# Patient Record
Sex: Male | Born: 1965
Health system: Southern US, Community
[De-identification: ages and names within clinical notes are randomized; demographics above are authoritative.]

---

## 2005-11-17 ENCOUNTER — Emergency Department (HOSPITAL_COMMUNITY): Admission: EM | Admit: 2005-11-17 | Discharge: 2005-11-17 | Payer: Self-pay | Admitting: Family Medicine

## 2006-09-11 ENCOUNTER — Emergency Department (HOSPITAL_COMMUNITY): Admission: EM | Admit: 2006-09-11 | Discharge: 2006-09-11 | Payer: Self-pay | Admitting: Family Medicine

## 2007-03-17 ENCOUNTER — Emergency Department (HOSPITAL_COMMUNITY): Admission: EM | Admit: 2007-03-17 | Discharge: 2007-03-17 | Payer: Self-pay | Admitting: Family Medicine

## 2007-05-17 ENCOUNTER — Emergency Department (HOSPITAL_COMMUNITY): Admission: EM | Admit: 2007-05-17 | Discharge: 2007-05-17 | Payer: Self-pay | Admitting: Family Medicine

## 2010-11-11 LAB — INFLUENZA A AND B ANTIGEN (CONVERTED LAB): Inflenza A Ag: NEGATIVE

## 2010-11-15 LAB — POCT RAPID STREP A: Streptococcus, Group A Screen (Direct): NEGATIVE

## 2011-02-27 ENCOUNTER — Emergency Department (HOSPITAL_COMMUNITY): Payer: No Typology Code available for payment source

## 2011-02-27 ENCOUNTER — Emergency Department (HOSPITAL_COMMUNITY)
Admission: EM | Admit: 2011-02-27 | Discharge: 2011-02-27 | Disposition: A | Discharge status: Home / Self Care | Payer: No Typology Code available for payment source | Attending: Emergency Medicine | Admitting: Emergency Medicine

## 2011-02-27 DIAGNOSIS — Y9229 Other specified public building as the place of occurrence of the external cause: Secondary | ICD-10-CM | POA: Insufficient documentation

## 2011-02-27 DIAGNOSIS — M545 Low back pain, unspecified: Secondary | ICD-10-CM | POA: Insufficient documentation

## 2011-02-27 DIAGNOSIS — S20219A Contusion of unspecified front wall of thorax, initial encounter: Secondary | ICD-10-CM

## 2011-02-27 DIAGNOSIS — R079 Chest pain, unspecified: Secondary | ICD-10-CM | POA: Insufficient documentation

## 2011-02-27 DIAGNOSIS — S39012A Strain of muscle, fascia and tendon of lower back, initial encounter: Secondary | ICD-10-CM

## 2011-02-27 MED ORDER — DIAZEPAM 5 MG PO TABS: 5.0000 mg | ORAL_TABLET | Freq: Three times a day (TID) | ORAL | Status: DC | PRN

## 2011-02-27 MED ORDER — DIAZEPAM 5 MG PO TABS
5.0000 mg | ORAL_TABLET | Freq: Once | ORAL | Status: AC
  Administered 2011-02-27: 5 mg via ORAL
  Filled 2011-02-27: qty 1

## 2011-02-27 MED ORDER — IBUPROFEN 600 MG PO TABS: 600.0000 mg | ORAL_TABLET | Freq: Four times a day (QID) | ORAL | Status: DC | PRN

## 2011-02-27 MED ORDER — HYDROCODONE-ACETAMINOPHEN 5-500 MG PO TABS: 1.0000 | ORAL_TABLET | Freq: Four times a day (QID) | ORAL | Status: DC | PRN

## 2011-02-27 MED ORDER — IBUPROFEN 800 MG PO TABS
800.0000 mg | ORAL_TABLET | Freq: Once | ORAL | Status: AC
  Administered 2011-02-27: 800 mg via ORAL
  Filled 2011-02-27: qty 1
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
## 2011-02-28 NOTE — ED Provider Notes (Signed)
Medical screening examination/treatment/procedure(s) were performed by non-physician practitioner and as supervising physician I was immediately available for consultation/collaboration.  Tekisha Darcey, MD 02/28/11 0120 

## 2011-03-02 ENCOUNTER — Emergency Department (HOSPITAL_COMMUNITY)
Admission: EM | Admit: 2011-03-02 | Discharge: 2011-03-02 | Disposition: A | Discharge status: Home / Self Care | Payer: No Typology Code available for payment source | Attending: Emergency Medicine | Admitting: Emergency Medicine

## 2011-03-02 ENCOUNTER — Encounter (HOSPITAL_COMMUNITY): Payer: Self-pay | Admitting: *Deleted

## 2011-03-02 DIAGNOSIS — M549 Dorsalgia, unspecified: Secondary | ICD-10-CM

## 2011-03-02 MED ORDER — OXYCODONE-ACETAMINOPHEN 5-325 MG PO TABS: 2.0000 | ORAL_TABLET | ORAL | Status: DC | PRN

## 2011-03-02 MED ORDER — ONDANSETRON 4 MG PO TBDP
4.0000 mg | ORAL_TABLET | Freq: Once | ORAL | Status: AC
  Administered 2011-03-02: 4 mg via ORAL
  Filled 2011-03-02: qty 1

## 2011-03-02 MED ORDER — MORPHINE SULFATE 4 MG/ML IJ SOLN
4.0000 mg | Freq: Once | INTRAMUSCULAR | Status: AC
  Administered 2011-03-02: 4 mg via INTRAMUSCULAR
  Filled 2011-03-02: qty 1
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
## 2011-03-09 ENCOUNTER — Encounter (HOSPITAL_COMMUNITY): Payer: Self-pay | Admitting: Emergency Medicine

## 2011-03-09 ENCOUNTER — Emergency Department (HOSPITAL_COMMUNITY)
Admission: EM | Admit: 2011-03-09 | Discharge: 2011-03-09 | Disposition: A | Discharge status: Home / Self Care | Payer: No Typology Code available for payment source | Attending: Emergency Medicine | Admitting: Emergency Medicine

## 2011-03-09 DIAGNOSIS — R079 Chest pain, unspecified: Secondary | ICD-10-CM | POA: Insufficient documentation

## 2011-03-09 DIAGNOSIS — T148XXA Other injury of unspecified body region, initial encounter: Secondary | ICD-10-CM

## 2011-03-09 DIAGNOSIS — M549 Dorsalgia, unspecified: Secondary | ICD-10-CM | POA: Insufficient documentation
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
## 2011-08-14 ENCOUNTER — Ambulatory Visit: Payer: 59

## 2011-08-14 ENCOUNTER — Ambulatory Visit (INDEPENDENT_AMBULATORY_CARE_PROVIDER_SITE_OTHER): Payer: 59 | Admitting: Emergency Medicine

## 2011-08-14 VITALS — BP 106/70 | HR 77 | Temp 98.0°F | Resp 18 | Ht 66.0 in | Wt 206.0 lb

## 2011-08-14 DIAGNOSIS — M545 Low back pain, unspecified: Secondary | ICD-10-CM

## 2011-08-14 MED ORDER — PREDNISONE 20 MG PO TABS: ORAL_TABLET | ORAL | Status: DC

## 2011-08-14 MED ORDER — CYCLOBENZAPRINE HCL 10 MG PO TABS: ORAL_TABLET | ORAL | Status: DC

## 2011-08-14 NOTE — Patient Instructions (Addendum)
C?ng Th?t L?ng (Lumbrosacral Strain) C?ng Th?t L?ng l m?t trong nh?ng nguyn nhn ph? bi?n nh?t c?a ?au l?ng. C nhi?u nguyn nhn c th? gy ra ?au l?ng. H?u h?t khng ph?i l tnh tr?ng nghim tr?ng.  NGUYN NHN X??ng s?ng (c?t s?ng) ???c t?o t? 24 ??t s?ng chnh ngoi x??ng cng v x??ng c?t. Cc x??ng ny ???c gi? v?i nhau b?ng m x? c?ng ???c g?i l dy ch?ng v c? b?ng c?. Cc r? th?n kinh xuyn qua cc l? gi?a cc ??t s?ng. S? di chuy?n ??t ng?t ho?c ch?n th??ng l?ng c th? gy t?n th??ng ho?c gy p l?c ln nh?ng dy th?n kinh ny. ?i?u ny c th? d?n ??n ?au l?ng c?c b? ho?c d?ch chuy?n (b?c x?) ?au xu?ng mng v xu?ng chn. Tnh tr?ng ???c bi?t ??n nh? l ?au th?n kinh t?a (?au nhi t? mng xu?ng ph?n sau chn) th??ng k?t h?p v?i thot v? ??a ??m. ?au c th? ch? gy ra b?i s? co th?t c?. Chuyn gia ch?m Refugio y t? c th? th??ng tm th?y nguyn nhn ?au t? chi ti?t c?a tri?u ch?ng v xt nghi?m. Trong m?t s? tr??ng h?p, b?n c th? c?n m?t s? ki?m tra (ch?ng h?n nh? ch?p x-quang), nh?ng nh?ng ki?m tra ny c th? khng ph h?p v?i b?n. Chuyn gia ch?m [place] y t? s? lm vi?c v?i b?n ?? quy?t ??nh xem b?n c c?n b?t c? ki?m tra no khng d?a vo xt nghi?m c? th? c?a b?n. H??NG D?N CH?M Nile T?I NH  Trnh phong cch s?ng t ho?t ??ng. T?p luy?n ch? ??ng, theo h??ng d?n c?a chuyn gia ch?m [place] y t?, l v? kh t?t nh?t ch?ng l?i ?au l?ng.   N?u b?n ?ang khng ? trong tnh tr?ng th? ch?t thch h?p cho n, trnh nh?ng ho?t ??ng th? ch?t n?ng (qu?n v?t, l??t vn n??c). ?i?u ny c th? lm tr?m tr?ng thm v/ho?c t?o ra v?n ??.   N?u b?n c m?t v?n ?? v? l?ng, trnh nh?ng mn th? thao ?i h?i ph?i chuy?n ??ng c? th? ??t ng?t. B?i v ?i b? th??ng l nh?ng ho?t ??ng an ton h?n.   Duy tr t? th? t?t.   [place] b? qu cn (bo ph).   Ch? n?m ngh? trn gi??ng trong giai ?o?n cng c?c, ??t ng?t nh?t (c?p tnh). Chuyn gia ch?m [place] y t? s? gip b?n xc ??nh xem b?n c?n n?m ngh? trn gi??ng trong bao lu.    ??i v?i nh?ng ?i?u ki?n c?p tnh, b?n c th? ch??m ? ln vng b? th??ng.   Cho ? bo vo ti nh?a.   ??t kh?n t?m gi?a da v ti.   Ch??m ? trong 15 ??n 20 pht m?i l?n, 2 gi? m?t l?n, ho?c khi c?n thi?t.   Sau khi b?n kh h?n v n?ng ??ng h?n, vi?c ch??m nhi?t trong 30 pht tr??c khi ho?t ??ng c th? gip ch.  G?p chuyn gia ch?m Daisy y t? n?u b?n b? ?au ko di h?n mong ??i. Chuyn gia ch?m  y t? c th? gip ho?c t? v?n cho b?n v? nh?ng bi t?p v/ho?c li?u php thch h?p, n?u c?n. V?i ?i?u ki?n, h?u h?t v?n ?? v? l?ng c th? trnh ???c.  HY NGAY L?P T?C THAM V?N V?I CHUYN GIA Y T? N?U:  B?n b? t c?ng, ?au nhi dy th?n kinh, y?u ho?c v?n ?? v?i vi?c s? d?ng tay ho?c chn.   B?n b? ?au  l?ng n?ng khng gi?m ???c nh? thu?c.   C s? thay ??i trong s? ki?m sot ru?t ho?c bng quang.   B?n b? ?au gia t?ng trong b?t k? khu v?c no c?a c? th?, bao g?m d? dy ho?c b?ng.   B?n th?y kh th?, hoa m?t ho?c ng?t.   B?n c?m th?y kh ch?u trong d? dy (bu?n nn), nn ho?c v m? hi.   B?n th?y bi?n mu ngn chn ho?c chn ho?c bn chn tr? nn r?t l?nh.   ?au l?ng tr? nn t? h?n.   B?n b? s?t.  ??M B?O B?N:   Hi?u cc h??ng d?n ny.   Theo di tnh tr?ng c?a mnh.   Yu c?u tr? gip ngay l?p t?c n?u b?n c?m th?y khng kh?e ho?c tnh tr?ng tr? nn t?i t? h?n.  Document Released: 11/17/2004 Document Revised: 01/27/2011 [facility] Patient Information 2012 [place], [place].Sciatica Sciatica is a weakness and/or changes in sensation (tingling, jolts, hot and cold, numbness) along the path the sciatic nerve travels. Irritation or damage to lumbar nerve roots is often also referred to as lumbar radiculopathy.  Lumbar radiculopathy (Sciatica) is the most common form of this problem. Radiculopathy can occur in any of the nerves coming out of the spinal cord. The problems caused depend on which nerves are involved. The sciatic nerve is the large nerve supplying the branches of nerves going  from the hip to the toes. It often causes a numbness or weakness in the skin and/or muscles that the sciatic nerve serves. It also may cause symptoms (problems) of pain, burning, tingling, or electric shock-like feelings in the path of this nerve. This usually comes from injury to the fibers that make up the sciatic nerve. Some of these symptoms are low back pain and/or unpleasant feelings in the following areas:  From the mid-buttock down the back of the leg to the back of the knee.   And/or the outside of the calf and top of the foot.   And/or behind the inner ankle to the sole of the foot.  CAUSES   Herniated or slipped disc. Discs are the little cushions between the bones in the back.   Pressure by the piriformis muscle in the buttock on the sciatic nerve (Piriformis Syndrome).   Misalignment of the bones in the lower back and buttocks (Sacroiliac Joint Derangement).   Narrowing of the spinal canal that puts pressure on or pinches the fibers that make up the sciatic nerve.   A slipped vertebra that is out of line with those above or beneath it.   Abnormality of the nervous system itself so that nerve fibers do not transmit signals properly, especially to feet and calves (neuropathy).   Tumor (this is rare).  Your caregiver can usually determine the cause of your sciatica and begin the treatment most likely to help you. TREATMENT  Taking over-the-counter painkillers, physical therapy, rest, exercise, spinal manipulation, and injections of anesthetics and/or steroids may be used. Surgery, acupuncture, and Yoga can also be effective. Mind over matter techniques, mental imagery, and changing factors such as your bed, chair, desk height, posture, and activities are other treatments that may be helpful. You and your caregiver can help determine what is best for you. With proper diagnosis, the cause of most sciatica can be identified and removed. Communication and cooperation between your  caregiver and you is essential. If you are not successful immediately, do not be discouraged. With time, a proper treatment can be found that will  make you comfortable. HOME CARE INSTRUCTIONS   If the pain is coming from a problem in the back, applying ice to that area for 15 to 20 minutes, 3 to 4 times per day while awake, may be helpful. Put the ice in a plastic bag. Place a towel between the bag of ice and your skin.   You may exercise or perform your usual activities if these do not aggravate your pain, or as suggested by your caregiver.   Only take over-the-counter or prescription medicines for pain, discomfort, or fever as directed by your caregiver.   If your caregiver has given you a follow-up appointment, it is very important to keep that appointment. Not keeping the appointment could result in a chronic or permanent injury, pain, and disability. If there is any problem keeping the appointment, you must call back to this facility for assistance.  SEEK IMMEDIATE MEDICAL CARE IF:   You experience loss of control of bowel or bladder.   You have increasing weakness in the trunk, buttocks, or legs.   There is numbness in any areas from the hip down to the toes.   You have difficulty walking or keeping your balance.   You have any of the above, with fever or forceful vomiting.  Document Released: 02/01/2001 Document Revised: 01/27/2011 Document Reviewed: 09/21/2007 [facility] Patient Information 2012 [place], [place].
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
## 2011-08-17 ENCOUNTER — Ambulatory Visit (INDEPENDENT_AMBULATORY_CARE_PROVIDER_SITE_OTHER): Payer: 59 | Admitting: Emergency Medicine

## 2011-08-17 VITALS — BP 127/87 | HR 86 | Temp 98.3°F | Resp 16 | Ht 66.0 in | Wt 206.2 lb

## 2011-08-17 DIAGNOSIS — M549 Dorsalgia, unspecified: Secondary | ICD-10-CM

## 2011-08-17 MED ORDER — MELOXICAM 7.5 MG PO TABS: ORAL_TABLET | ORAL | Status: DC

## 2011-08-17 NOTE — Patient Instructions (Signed)
C?ng Th?t L?ng (Lumbrosacral Strain) C?ng Th?t L?ng l m?t trong nh?ng nguyn nhn ph? bi?n nh?t c?a ?au l?ng. C nhi?u nguyn nhn c th? gy ra ?au l?ng. H?u h?t khng ph?i l tnh tr?ng nghim tr?ng.  NGUYN NHN X??ng s?ng (c?t s?ng) ???c t?o t? 24 ??t s?ng chnh ngoi x??ng cng v x??ng c?t. Cc x??ng ny ???c gi? v?i nhau b?ng m x? c?ng ???c g?i l dy ch?ng v c? b?ng c?. Cc r? th?n kinh xuyn qua cc l? gi?a cc ??t s?ng. S? di chuy?n ??t ng?t ho?c ch?n th??ng l?ng c th? gy t?n th??ng ho?c gy p l?c ln nh?ng dy th?n kinh ny. ?i?u ny c th? d?n ??n ?au l?ng c?c b? ho?c d?ch chuy?n (b?c x?) ?au xu?ng mng v xu?ng chn. Tnh tr?ng ???c bi?t ??n nh? l ?au th?n kinh t?a (?au nhi t? mng xu?ng ph?n sau chn) th??ng k?t h?p v?i thot v? ??a ??m. ?au c th? ch? gy ra b?i s? co th?t c?. Chuyn gia ch?m Refugio y t? c th? th??ng tm th?y nguyn nhn ?au t? chi ti?t c?a tri?u ch?ng v xt nghi?m. Trong m?t s? tr??ng h?p, b?n c th? c?n m?t s? ki?m tra (ch?ng h?n nh? ch?p x-quang), nh?ng nh?ng ki?m tra ny c th? khng ph h?p v?i b?n. Chuyn gia ch?m Woodruff y t? s? lm vi?c v?i b?n ?? quy?t ??nh xem b?n c c?n b?t c? ki?m tra no khng d?a vo xt nghi?m c? th? c?a b?n. H??NG D?N CH?M Nile T?I NH  Trnh phong cch s?ng t ho?t ??ng. T?p luy?n ch? ??ng, theo h??ng d?n c?a chuyn gia ch?m [place] y t?, l v? kh t?t nh?t ch?ng l?i ?au l?ng.   N?u b?n ?ang khng ? trong tnh tr?ng th? ch?t thch h?p cho n, trnh nh?ng ho?t ??ng th? ch?t n?ng (qu?n v?t, l??t vn n??c). ?i?u ny c th? lm tr?m tr?ng thm v/ho?c t?o ra v?n ??.   N?u b?n c m?t v?n ?? v? l?ng, trnh nh?ng mn th? thao ?i h?i ph?i chuy?n ??ng c? th? ??t ng?t. B?i v ?i b? th??ng l nh?ng ho?t ??ng an ton h?n.   Duy tr t? th? t?t.   [place] b? qu cn (bo ph).   Ch? n?m ngh? trn gi??ng trong giai ?o?n cng c?c, ??t ng?t nh?t (c?p tnh). Chuyn gia ch?m [place] y t? s? gip b?n xc ??nh xem b?n c?n n?m ngh? trn gi??ng trong bao lu.    ??i v?i nh?ng ?i?u ki?n c?p tnh, b?n c th? ch??m ? ln vng b? th??ng.   Cho ? bo vo ti nh?a.   ??t kh?n t?m gi?a da v ti.   Ch??m ? trong 15 ??n 20 pht m?i l?n, 2 gi? m?t l?n, ho?c khi c?n thi?t.   Sau khi b?n kh h?n v n?ng ??ng h?n, vi?c ch??m nhi?t trong 30 pht tr??c khi ho?t ??ng c th? gip ch.  G?p chuyn gia ch?m Daisy y t? n?u b?n b? ?au ko di h?n mong ??i. Chuyn gia ch?m  y t? c th? gip ho?c t? v?n cho b?n v? nh?ng bi t?p v/ho?c li?u php thch h?p, n?u c?n. V?i ?i?u ki?n, h?u h?t v?n ?? v? l?ng c th? trnh ???c.  HY NGAY L?P T?C THAM V?N V?I CHUYN GIA Y T? N?U:  B?n b? t c?ng, ?au nhi dy th?n kinh, y?u ho?c v?n ?? v?i vi?c s? d?ng tay ho?c chn.   B?n b? ?au  l?ng n?ng khng gi?m ???c nh? thu?c.   C s? thay ??i trong s? ki?m sot ru?t ho?c bng quang.   B?n b? ?au gia t?ng trong b?t k? khu v?c no c?a c? th?, bao g?m d? dy ho?c b?ng.   B?n th?y kh th?, hoa m?t ho?c ng?t.   B?n c?m th?y kh ch?u trong d? dy (bu?n nn), nn ho?c v m? hi.   B?n th?y bi?n mu ngn chn ho?c chn ho?c bn chn tr? nn r?t l?nh.   ?au l?ng tr? nn t? h?n.   B?n b? s?t.  ??M B?O B?N:   Hi?u cc h??ng d?n ny.   Theo di tnh tr?ng c?a mnh.   Yu c?u tr? gip ngay l?p t?c n?u b?n c?m th?y khng kh?e ho?c tnh tr?ng tr? nn t?i t? h?n.  Document Released: 11/17/2004 Document Revised: 01/27/2011 [facility] Patient Information 2012 [place], [place].Lumbosacral Strain Lumbosacral strain is one of the most common causes of back pain. There are many causes of back pain. Most are not serious conditions. CAUSES  Your backbone (spinal column) is made up of 24 main vertebral bodies, the sacrum, and the coccyx. These are held together by muscles and tough, fibrous tissue (ligaments). Nerve roots pass through the openings between the vertebrae. A sudden move or injury to the back may cause injury to, or pressure on, these nerves. This may result in localized back pain  or pain movement (radiation) into the buttocks, down the leg, and into the foot. Sharp, shooting pain from the buttock down the back of the leg (sciatica) is frequently associated with a ruptured (herniated) disk. Pain may be caused by muscle spasm alone. Your caregiver can often find the cause of your pain by the details of your symptoms and an exam. In some cases, you may need tests (such as X-rays). Your caregiver will work with you to decide if any tests are needed based on your specific exam. HOME CARE INSTRUCTIONS   Avoid an underactive lifestyle. Active exercise, as directed by your caregiver, is your greatest weapon against back pain.   Avoid hard physical activities (tennis, racquetball, waterskiing) if you are not in proper physical condition for it. This may aggravate or create problems.   If you have a back problem, avoid sports requiring sudden body movements. Swimming and walking are generally safer activities.   Maintain good posture.   Avoid becoming overweight (obese).   Use bed rest for only the most extreme, sudden (acute) episode. Your caregiver will help you determine how much bed rest is necessary.   For acute conditions, you may put ice on the injured area.   Put ice in a plastic bag.   Place a towel between your skin and the bag.   Leave the ice on for 15 to 20 minutes at a time, every 2 hours, or as needed.   After you are improved and more active, it may help to apply heat for 30 minutes before activities.  See your caregiver if you are having pain that lasts longer than expected. Your caregiver can advise appropriate exercises or therapy if needed. With conditioning, most back problems can be avoided. SEEK IMMEDIATE MEDICAL CARE IF:   You have numbness, tingling, weakness, or problems with the use of your arms or legs.   You experience severe back pain not relieved with medicines.   There is a change in bowel or bladder control.   You have increasing pain  in any area of the body,  including your belly (abdomen).   You notice shortness of breath, dizziness, or feel faint.   You feel sick to your stomach (nauseous), are throwing up (vomiting), or become sweaty.   You notice discoloration of your toes or legs, or your feet get very cold.   Your back pain is getting worse.   You have a fever.  MAKE SURE YOU:   Understand these instructions.   Will watch your condition.   Will get help right away if you are not doing well or get worse.  Document Released: 11/17/2004 Document Revised: 01/27/2011 Document Reviewed: 05/09/2008 [facility] Patient Information 2012 [place], [place].

## 2011-08-17 NOTE — Progress Notes (Signed)
  Subjective:    Patient ID: Joshua Garrett, male    DOB: 02-Oct-1965, 46 y.o.   MRN: [id number]  HPI patient here to recheck on his back injury. He has a history of a ruptured disc and is currently finishing prednisone. He is feeling markedly better however still has severe pain with forward flexion.    Review of Systems     Objective:   Physical Exam patient has limited motion with forward flexion to deep tendon reflexes on the lower extremities are 2+ at the straight leg raising was negative to 90 bilaterally        Assessment & Plan:  Patient under treatment for now less strain with possible ruptured disc. He has improved with treatment. We'll give him information about LS strains. He is to return to clinic on Sunday for recheck

## 2011-08-21 ENCOUNTER — Ambulatory Visit (INDEPENDENT_AMBULATORY_CARE_PROVIDER_SITE_OTHER): Payer: 59 | Admitting: Emergency Medicine

## 2011-08-21 VITALS — BP 107/69 | HR 86 | Temp 97.8°F | Resp 16 | Ht 66.0 in | Wt 209.0 lb

## 2011-08-21 DIAGNOSIS — M549 Dorsalgia, unspecified: Secondary | ICD-10-CM

## 2011-08-21 DIAGNOSIS — M545 Low back pain, unspecified: Secondary | ICD-10-CM

## 2011-08-21 MED ORDER — MELOXICAM 7.5 MG PO TABS: ORAL_TABLET | ORAL | Status: DC

## 2011-08-21 MED ORDER — CYCLOBENZAPRINE HCL 10 MG PO TABS: ORAL_TABLET | ORAL | Status: DC

## 2011-08-21 NOTE — Progress Notes (Signed)
  Subjective:    Patient ID: Lenis Nettleton, male    DOB: 07/25/1965, 46 y.o.   MRN: [id number]  HPI 46 Y.O. Male here for follow up on low back pain, has not been to work since last office visit. Daughter here to help translate due to language barrier.  States he is doing better, still complaining of pain when extending back. Also pain going down right leg.Pt feels that he can go back to work if given restrictions. Pt is off this upcoming week, so this will help with recovery.  Review of Systems     Objective:   Physical Exam Neurological:deep tender reflex 2+  Pain in straightening legs at 80 degrees.moving much better still has limitations with back extension.       Assessment & Plan:  Pt given back manuel with exercises. Will continue on mobic and flexeril. Will be given note with restrictions of no lifting greater than 20lbs, no repetitive twisting/bending/climbing. Return to work on July 8th with these restrictions for 2 weeks and then to follow up with me.

## 2011-12-11 ENCOUNTER — Ambulatory Visit (INDEPENDENT_AMBULATORY_CARE_PROVIDER_SITE_OTHER): Payer: 59 | Admitting: Family Medicine

## 2011-12-11 VITALS — BP 114/73 | HR 86 | Temp 98.0°F | Resp 17 | Ht 66.5 in | Wt 206.0 lb

## 2011-12-11 DIAGNOSIS — R1013 Epigastric pain: Secondary | ICD-10-CM

## 2011-12-11 DIAGNOSIS — R1032 Left lower quadrant pain: Secondary | ICD-10-CM

## 2011-12-11 LAB — POCT CBC
Granulocyte percent: 58.1 %G (ref 37–80)
HCT, POC: 47.3 % (ref 43.5–53.7)
Hemoglobin: 14 g/dL — AB (ref 14.1–18.1)
Lymph, poc: 2.5 (ref 0.6–3.4)
MCH, POC: 22.5 pg — AB (ref 27–31.2)
MCHC: 29.6 g/dL — AB (ref 31.8–35.4)
MCV: 76.2 fL — AB (ref 80–97)
MID (cbc): 0.5 (ref 0–0.9)
MPV: 10.2 fL (ref 0–99.8)
POC Granulocyte: 4.1 (ref 2–6.9)
POC LYMPH PERCENT: 35.4 %L (ref 10–50)
POC MID %: 6.5 %M (ref 0–12)
Platelet Count, POC: 229 10*3/uL (ref 142–424)
RBC: 6.21 M/uL — AB (ref 4.69–6.13)
RDW, POC: 14.7 %
WBC: 7.1 10*3/uL (ref 4.6–10.2)

## 2011-12-11 LAB — POCT URINALYSIS DIPSTICK
Bilirubin, UA: NEGATIVE
Blood, UA: NEGATIVE
Glucose, UA: NEGATIVE
Ketones, UA: NEGATIVE
Leukocytes, UA: NEGATIVE
Nitrite, UA: NEGATIVE
Protein, UA: NEGATIVE
Spec Grav, UA: 1.02
Urobilinogen, UA: 1
pH, UA: 7

## 2011-12-11 LAB — POCT UA - MICROSCOPIC ONLY
Casts, Ur, LPF, POC: NEGATIVE
Crystals, Ur, HPF, POC: NEGATIVE
Mucus, UA: NEGATIVE
Yeast, UA: NEGATIVE

## 2011-12-11 MED ORDER — OMEPRAZOLE 20 MG PO CPDR: 20.0000 mg | DELAYED_RELEASE_CAPSULE | Freq: Every day | ORAL | Status: DC

## 2011-12-11 NOTE — Progress Notes (Signed)
46 yo [place] man who works in [employer] and comes in with 1 week of "feeling bad" with some LLQ and epigastric pain.  Patient is seen with an interpreter was young woman and he is accompanied by his wife. He does not speak much [place].  Objective: Patient does not appear to be in any acute distress. Chest: Clear Heart: Regular no murmur: Abdomen: Soft without HSM, masses, guarding, or rebound.  He seems to have mild LLQ pain with deep palpation. Skin: Warm and dry without rashes Extremities: No edema Results for orders placed in visit on 12/11/11  POCT CBC      Component Value Range   WBC 7.1  4.6 - 10.2 K/uL   Lymph, poc 2.5  0.6 - 3.4   POC LYMPH PERCENT 35.4  10 - 50 %L   MID (cbc) 0.5  0 - 0.9   POC MID % 6.5  0 - 12 %M   POC Granulocyte 4.1  2 - 6.9   Granulocyte percent 58.1  37 - 80 %G   RBC 6.21 (*) 4.69 - 6.13 M/uL   Hemoglobin 14.0 (*) 14.1 - 18.1 g/dL   HCT, POC 84.6  96.2 - 53.7 %   MCV 76.2 (*) 80 - 97 fL   MCH, POC 22.5 (*) 27 - 31.2 pg   MCHC 29.6 (*) 31.8 - 35.4 g/dL   RDW, POC 95.2     Platelet Count, POC 229  142 - 424 K/uL   MPV 10.2  0 - 99.8 fL  POCT UA - MICROSCOPIC ONLY      Component Value Range   WBC, Ur, HPF, POC 0-1     RBC, urine, microscopic 0-1     Bacteria, U Microscopic trace     Mucus, UA neg     Epithelial cells, urine per micros 0-1     Crystals, Ur, HPF, POC neg     Casts, Ur, LPF, POC neg     Yeast, UA neg    POCT URINALYSIS DIPSTICK      Component Value Range   Color, UA yellow     Clarity, UA clear     Glucose, UA neg     Bilirubin, UA neg     Ketones, UA neg     Spec Grav, UA 1.020     Blood, UA neg     pH, UA 7.0     Protein, UA neg     Urobilinogen, UA 1.0     Nitrite, UA neg     Leukocytes, UA Negative       Assessment: mild gastritis vs viral syndrome  Plan:  1. Abdominal pain, LLQ  POCT CBC, POCT UA - Microscopic Only, POCT urinalysis dipstick  2. Epigastric abdominal pain  omeprazole (PRILOSEC) 20 MG capsule

## 2012-03-05 ENCOUNTER — Ambulatory Visit (INDEPENDENT_AMBULATORY_CARE_PROVIDER_SITE_OTHER): Payer: 59 | Admitting: Family Medicine

## 2012-03-05 VITALS — BP 122/78 | HR 98 | Temp 98.0°F | Resp 17 | Ht 67.0 in | Wt 212.0 lb

## 2012-03-05 DIAGNOSIS — J029 Acute pharyngitis, unspecified: Secondary | ICD-10-CM

## 2012-03-05 DIAGNOSIS — R059 Cough, unspecified: Secondary | ICD-10-CM

## 2012-03-05 DIAGNOSIS — R5381 Other malaise: Secondary | ICD-10-CM

## 2012-03-05 DIAGNOSIS — R05 Cough: Secondary | ICD-10-CM

## 2012-03-05 LAB — POCT CBC
Granulocyte percent: 56.1 %G (ref 37–80)
HCT, POC: 44.9 % (ref 43.5–53.7)
MCH, POC: 23.4 pg — AB (ref 27–31.2)
MCV: 75.7 fL — AB (ref 80–97)
MID (cbc): 0.4 (ref 0–0.9)
POC LYMPH PERCENT: 38 %L (ref 10–50)
Platelet Count, POC: 188 10*3/uL (ref 142–424)
RBC: 5.93 M/uL (ref 4.69–6.13)
WBC: 7.1 10*3/uL (ref 4.6–10.2)

## 2012-03-05 LAB — POCT INFLUENZA A/B
Influenza A, POC: NEGATIVE
Influenza B, POC: NEGATIVE

## 2012-03-05 MED ORDER — AZITHROMYCIN 250 MG PO TABS: ORAL_TABLET | ORAL | Status: DC

## 2012-03-05 NOTE — Progress Notes (Signed)
[facility] [address], [place] [address] [phone]  Date:  03/05/2012   Name:  Joshua Garrett   DOB:  1966/01/17   MRN:  [id number]  PCP:  Tally Due, MD    Chief Complaint: Cough, Chills, Fever and Abdominal Pain   History of Present Illness:  Joshua Garrett is a 47 y.o. very pleasant male patient who presents with the following:  Here today with illness.  He got sick 2 days ago- he has a cough.  He also has a ST.  They have not checked his temperature.  He has a stuffy nose.  He has pain in his trunk from coughing a lot.    He is not throwing up or having diarrhea.  He has been eating   He has a history of GERD.  Otherwise he is generally healthy.   He took nyquil last night and this morning.  He took it around 6:30 am.    There is no problem list on file for this patient.   History reviewed. No pertinent past medical history.  History reviewed. No pertinent past surgical history.  History  Substance Use Topics  . Smoking status: Former Smoker    Quit date: 02/22/1987  . Smokeless tobacco: Never Used  . Alcohol Use: Not on file    History reviewed. No pertinent family history.  No Known Allergies  Medication list has been reviewed and updated.  Current Outpatient Prescriptions on File Prior to Visit  Medication Sig Dispense Refill  . cyclobenzaprine (FLEXERIL) 10 MG tablet Take one tablet at bedtime  30 tablet  1  . meloxicam (MOBIC) 7.5 MG tablet Take 1-2 tablets daily for back pain. Do not start this medication until you are finished with your deltasone  30 tablet  1  . omeprazole (PRILOSEC) 20 MG capsule Take 1 capsule (20 mg total) by mouth daily.  7 capsule  3  . predniSONE (DELTASONE) 20 MG tablet Take 3 tablets a day for 3 days 2 tablets a day for 3 days one tablet a day for 3 days  21 tablet  0    Review of Systems:  As per HPI- otherwise negative.   Physical Examination: Filed Vitals:   03/05/12 1127  BP: 122/78    Pulse: 98  Temp: 98 F (36.7 C)  Resp: 17   Filed Vitals:   03/05/12 1127  Height: 5\' 7"  (1.702 m)  Weight: 212 lb (96.163 kg)   Body mass index is 33.20 kg/(m^2). Ideal Body Weight: Weight in (lb) to have BMI = 25: 159.3   GEN: WDWN, NAD, Non-toxic, A & O x 3, overweight HEENT: Atraumatic, Normocephalic. Neck supple. No masses, No LAD. Bilateral TM wnl, oropharynx normal.  PEERL,EOMI.   Ears and Nose: No external deformity. CV: RRR, No M/G/R. No JVD. No thrill. No extra heart sounds. PULM: CTA B, no wheezes, crackles, rhonchi. No retractions. No resp. distress. No accessory muscle use. ABD: S, NT, ND EXTR: No c/c/e NEURO Normal gait.  PSYCH: Normally interactive. Conversant. Not depressed or anxious appearing.  Calm demeanor.   Results for orders placed in visit on 03/05/12  POCT CBC      Component Value Range   WBC 7.1  4.6 - 10.2 K/uL   Lymph, poc 2.7  0.6 - 3.4   POC LYMPH PERCENT 38.0  10 - 50 %L   MID (cbc) 0.4  0 - 0.9   POC MID % 5.9  0 -  12 %M   POC Granulocyte 4.0  2 - 6.9   Granulocyte percent 56.1  37 - 80 %G   RBC 5.93  4.69 - 6.13 M/uL   Hemoglobin 13.9 (*) 14.1 - 18.1 g/dL   HCT, POC 16.1  09.6 - 53.7 %   MCV 75.7 (*) 80 - 97 fL   MCH, POC 23.4 (*) 27 - 31.2 pg   MCHC 31.0 (*) 31.8 - 35.4 g/dL   RDW, POC 04.5     Platelet Count, POC 188  142 - 424 K/uL   MPV 9.3  0 - 99.8 fL  POCT RAPID STREP A (OFFICE)      Component Value Range   Rapid Strep A Screen Negative  Negative  POCT INFLUENZA A/B      Component Value Range   Influenza A, POC Negative     Influenza B, POC Negative       Assessment and Plan: 1. Sore throat  POCT rapid strep A, azithromycin (ZITHROMAX) 250 MG tablet, POCT Influenza A/B  2. Malaise  POCT CBC, POCT Influenza A/B  3. Cough  azithromycin (ZITHROMAX) 250 MG tablet, POCT Influenza A/B   Flu- like symptoms but negative flu- test and no fever.  Will treat with azithromycin as above for bronchitis. Let me know if not better in  the next couple of days- Sooner if worse.     Abbe Amsterdam, MD

## 2012-03-05 NOTE — Patient Instructions (Addendum)
Use the medication as directed.  You can use nyquil and other OTC medications as needed for your symptoms.  However, do not use nyquil when you need to drive  Let me know if you are not better in the next 2 days- Sooner if worse.

## 2012-03-09 ENCOUNTER — Encounter: Payer: Self-pay | Admitting: Family Medicine

## 2012-04-01 ENCOUNTER — Ambulatory Visit (INDEPENDENT_AMBULATORY_CARE_PROVIDER_SITE_OTHER): Payer: 59 | Admitting: Internal Medicine

## 2012-04-01 VITALS — BP 127/77 | HR 84 | Temp 98.1°F | Resp 16 | Ht 66.0 in | Wt 214.0 lb

## 2012-04-01 DIAGNOSIS — H5462 Unqualified visual loss, left eye, normal vision right eye: Secondary | ICD-10-CM

## 2012-04-01 DIAGNOSIS — R51 Headache: Secondary | ICD-10-CM

## 2012-04-01 DIAGNOSIS — H5460 Unqualified visual loss, one eye, unspecified: Secondary | ICD-10-CM

## 2012-04-01 LAB — POCT CBC
HCT, POC: 43 % — AB (ref 43.5–53.7)
Lymph, poc: 2.9 (ref 0.6–3.4)
MCH, POC: 24.7 pg — AB (ref 27–31.2)
MCHC: 32.8 g/dL (ref 31.8–35.4)
MCV: 75.3 fL — AB (ref 80–97)
MID (cbc): 0.4 (ref 0–0.9)
POC LYMPH PERCENT: 40 %L (ref 10–50)
Platelet Count, POC: 190 10*3/uL (ref 142–424)
RDW, POC: 15.2 %

## 2012-04-01 MED ORDER — BUTALBITAL-APAP-CAFFEINE 50-500-40 MG PO CAPS: ORAL_CAPSULE | ORAL | Status: DC

## 2012-04-01 NOTE — Progress Notes (Signed)
  Subjective:    Patient ID: Joshua Garrett, male    DOB: [birth year], 47 y.o.   MRN: [id number]  HPIw/ translator Patient comes in today complaining of a headache that has lasted the past 3 days intermittently.  He did have a cough 2-3 weeks ago, but it has since resolved. Complains that a few times he became dizzy. No complaints of any other UR symptoms. No nausea. No problems sleeping. The headache radiates into his neck area. No problems with appetite. Some pain with head movement. No problems with walking. He does have issues with seeing things blurry when he is in pain. Pain increases with eye movement. No weakness in his extremities. No fever. No neck injury  No chronic illnesses No medications Treated with Zithromax early January 2014/bronchitis Works for a [employer] cabin [employer]  Review of Systems No fever chills or night sweats No recent weight loss With retrospective questioning following exam he says that his lefteye vision has been very poor since he was a child No Chest pain or palpitations No shortness of breath No trouble walking Mild dizziness does not interfere with activity No weakness    Objective:   Physical Exam Overweight Vital signs and otherwise normal Appears slightly uncomfortable Pupils equal reactive to light and accommodation/extraocular movements are conjugate though left side has a mild loss at the end of all ranges Funduscopic, though difficult, shows no hemorrhages or blurred disc margins Vision is grossly intact but on Snellen chart is 20/200 in the left eye Nares clear Throat clear No cervical nodes Tender along the right paracervical muscles posteriorly into the right trapezius but neck range of motion is without rigidity or significant pain Heart is regular without murmur Lungs clear Cranial nerves II through XII intact Deep tendon reflexes symmetrical Finger to nose intact Romberg negative Gait stable      Results for orders placed in visit on  04/01/12  POCT CBC      Result Value Range   WBC 7.3  4.6 - 10.2 K/uL   Lymph, poc 2.9  0.6 - 3.4   POC LYMPH PERCENT 40.0  10 - 50 %L   MID (cbc) 0.4  0 - 0.9   POC MID % 5.7  0 - 12 %M   POC Granulocyte 4.0  2 - 6.9   Granulocyte percent 54.3  37 - 80 %G   RBC 5.71  4.69 - 6.13 M/uL   Hemoglobin 14.1  14.1 - 18.1 g/dL   HCT, POC 56.2 (*) 13.0 - 53.7 %   MCV 75.3 (*) 80 - 97 fL   MCH, POC 24.7 (*) 27 - 31.2 pg   MCHC 32.8  31.8 - 35.4 g/dL   RDW, POC 86.5     Platelet Count, POC 190  142 - 424 K/uL   MPV 9.9  0 - 99.8 fL    Assessment & Plan:  Problem #1 new-onset headache Problem #2 neck tenderness Problem #3 poor vision left  With no specific neurological deficits this does not represent an acute central nervous system problem/will try to relieve headache With esgic, and set up ophthalmology evaluation next It may be difficult to communicate this appointment by phone 2 followup at once if worse= fever, nausea and vomiting, change in sensorium, changing gait etc.

## 2012-04-02 ENCOUNTER — Encounter: Payer: Self-pay | Admitting: Internal Medicine

## 2012-06-25 ENCOUNTER — Ambulatory Visit (INDEPENDENT_AMBULATORY_CARE_PROVIDER_SITE_OTHER): Payer: 59 | Admitting: Emergency Medicine

## 2012-06-25 VITALS — BP 122/88 | HR 105 | Temp 98.5°F | Resp 18 | Ht 66.0 in | Wt 207.8 lb

## 2012-06-25 DIAGNOSIS — J029 Acute pharyngitis, unspecified: Secondary | ICD-10-CM

## 2012-06-25 DIAGNOSIS — J018 Other acute sinusitis: Secondary | ICD-10-CM

## 2012-06-25 DIAGNOSIS — Z789 Other specified health status: Secondary | ICD-10-CM

## 2012-06-25 MED ORDER — MELOXICAM 15 MG PO TABS: 15.0000 mg | ORAL_TABLET | Freq: Every day | ORAL | Status: DC

## 2012-06-25 MED ORDER — PSEUDOEPHEDRINE-GUAIFENESIN ER 60-600 MG PO TB12: 1.0000 | ORAL_TABLET | Freq: Two times a day (BID) | ORAL | Status: DC

## 2012-06-25 MED ORDER — AMOXICILLIN-POT CLAVULANATE 875-125 MG PO TABS: 1.0000 | ORAL_TABLET | Freq: Two times a day (BID) | ORAL | Status: DC

## 2012-06-25 NOTE — Progress Notes (Signed)
Reviewed and agree.

## 2012-06-25 NOTE — Progress Notes (Signed)
[facility] [address], [place] [address] [phone]  Date:  06/25/2012   Name:  Joshua Garrett   DOB:  18-Oct-1965   MRN:  [id number]  PCP:  Tally Due, MD    Chief Complaint: pain in joints and Nasal Congestion   History of Present Illness:  Jamorian Dimaria is a 47 y.o. very pleasant male patient who presents with the following:  Soret throat, fever and malaise, and nasal congestion and drainage. Says he does not look at nasal mucous.  No cough, wheezing or shortness of breath. No nausea or vomiting.  No stool change.  No chills.  No rash. No tick exposure.  Has multiple joint pains involving hands and wrists.  No swelling or erythema.    No improvement with over the counter medications or other home remedies. Denies other complaint or health concern today.   There are no active problems to display for this patient.   History reviewed. No pertinent past medical history.  History reviewed. No pertinent past surgical history.  History  Substance Use Topics  . Smoking status: Never Smoker   . Smokeless tobacco: Never Used  . Alcohol Use: Not on file    History reviewed. No pertinent family history.  No Known Allergies  Medication list has been reviewed and updated.  No current outpatient prescriptions on file prior to visit.   No current facility-administered medications on file prior to visit.    Review of Systems:  As per HPI, otherwise negative.    Physical Examination: Filed Vitals:   06/25/12 1038  BP: 122/88  Pulse: 105  Temp: 98.5 F (36.9 C)  Resp: 18   Filed Vitals:   06/25/12 1038  Height: 5\' 6"  (1.676 m)  Weight: 207 lb 12.8 oz (94.257 kg)   Body mass index is 33.56 kg/(m^2). Ideal Body Weight: Weight in (lb) to have BMI = 25: 154.6  GEN: WDWN, NAD, Non-toxic, A & O x 3 HEENT: Atraumatic, Normocephalic. Neck supple. No masses, No LAD. Ears and Nose: No external deformity. CV: RRR, No M/G/R. No JVD. No thrill. No  extra heart sounds. PULM: CTA B, no wheezes, crackles, rhonchi. No retractions. No resp. distress. No accessory muscle use. ABD: S, NT, ND, +BS. No rebound. No HSM. EXTR: No c/c/e NEURO Normal gait.  PSYCH: Normally interactive. Conversant. Not depressed or anxious appearing.  Calm demeanor.    Assessment and Plan: Sinusitis augmentin mucinex mobic  Follow up in a week   Signed,  Phillips Odor, MD

## 2012-06-25 NOTE — Patient Instructions (Addendum)
Vim H?ng Do Vi Rt v Vi Khu?n  (Viral and Bacterial Pharyngitis) Vim h?ng l hi?n t??ng vim t?y ho?c nhi?m trng h?ng, ho?c ?au h?ng. NGUYN NHN ?a s? cc tr??ng h?p vim h?ng l do vi-rt v b? vim khi c?m l?nh. Tuy nhin, m?t s? tr??ng h?p vim h?ng l do vi khu?n (vi trng) lin c?u v cc vi khu?n khc gy ra. ?au h?ng c?ng c th? l do ch?y n??c pha sau m?i do d?n l?u xoang, d? ?ng, v ?i khi cn do h mi?ng khi ng?. ?au h?ng do nhi?m trng c th? ly t? ng??i ny sang ng??i khc qua ho, h?t h?i v dng chung ly ho?c bt ??a ?n u?ng. ?I?U TR? ?au h?ng do vi-rt th??ng ko di 3-4 ngy. B?nh do vi-rt s? ?? m khng c?n thu?c khng sinh. ?au h?ng do khu?n lin c?u v cc hi?n t??ng nhi?m trng do vi khu?n khc th??ng b?t ??u ?? kho?ng 24 ??n 48 gi? sau khi b?n b?t ??u u?ng khng sinh. H??NG D?N CH?M [place] T?I NH  N?u bc s? th?y b?n b? nhi?m trng do vi khu?n ho?c n?u xt nghi?m khu?n lin c?u c?a b?n l d??ng tnh, bc s? s? cho b?n ?i?u tr? b?ng khng sinh. B?n ph?i u?ng h?t ??t khng sinh!! N?u b?n khng u?ng h?t ??t khng sinh, b?n c th? s? ?m tr? l?i. N?u b?n b? ?au h?ng do khu?n lin c?u v khng u?ng h?t ton b? ??t thu?c, b?n c th? m?c b?nh tim ho?c th?n nguy hi?m.  U?ng th?t nhi?u n??c. Kho?ng 8-10 ly n??c m?i ngy. (V d? nh? n??c, n??c p hoa qu?, n??c qu?, Kool-aid, Gatorade, x-?a v.v...).  Ch? dng cc thu?c ???c bn khng c?n ??n thu?c c?a Bc s? ho?c theo toa c?a Bc s? ?? gi?m ?au, kh ch?u, hay s?t theo nh? h??ng d?n c?a Bc s?.  Ngh? ng?i nhi?u.  [place] h?ng b?ng n??c mu?i (1/2 tha c ph mu?i trong m?t ly n??c) t? 1-2 gi? m?t l?n khi c?n ?? th?y d? ch?u.  Ng?m k?o c?ng, ho?c vin thu?c ng?m/thu?c x?t ch?a ?au h?ng. HY THAM V?N V?I CHUYN GIA Y T?:   B?n n?i h?ch to v m?m trn c?.  B?n ln m? ?ay.  B?n ho ra ??m mu xanh, nu vng ho?c c mu.  Tr? h?n 3 thng tu?i c nhi?t ?? ?o ? h?u mn l 100,5 F (38,1 C) ho?c cao h?n trong h?n 1 ngy. HY NGAY L?P  T?C THAM V?N V?I CHUYN GIA Y T? N?U:  B?n b? c?ng c?.  B?n b? ch?y n??c m?i ho?c khng th? nu?t n??c ???c.  B?n b? nn m?a, khng th? gi? khng nn ra thu?c ho?c n??c.  B?n b? ?au d? d?i, u?ng cc lo?i thu?c ? ch? d?n khng c tc d?ng.  B?n kh th? (khng ph?i l do ng?t m?i).  B?n hay con c?a b?n khng th? h to mi?ng.  Xu?t hi?n ??, s?ng ph, hay ?au nhi?u b?t k? n?i no ? c?.  B?n b? s?t.  Tr? h?n 3 thng tu?i c nhi?t ?? ?o ? h?u mn l 102 F (38,9 C) ho?c cao h?n.  Tr? 3 thng tu?i ho?c nh? h?n c nhi?t ?? ?o ? h?u mn l 100,4 F (38 C) ho?c cao h?n. HY CH?C CH?N R?NG B?N:  Hi?u r nh?ng h??ng d?n khi xu?t vi?n.  S? theo di tnh tr?ng b?nh c?a b?n.  S? ??n khm b?nh  ngay l?p t?c nh? ? ???c h??ng d?n. Document Released: 02/07/2005 Document Revised: 05/02/2011 [facility] Patient Information 2013 [place], [place]. Vim xoang  (Sinusitis) Vim xoang l hi?n t??ng t?y ??, ?au nh?c v s?ng (vim) ? cc xoang c?nh m?i. Xoang c?nh m?i l cc ti kh trong x??ng c?a m?t (bn d??i m?t, gi?a trn ho?c trn m?t). Trong cc xoang c?nh m?i kh?e m?nh, d?ch nh?y c th? thot ra ngoi v khng kh c th? l?u thng qua chng theo ???ng m?i. Tuy nhin, khi cc xoang c?nh m?i b? vim, d?ch nh?y v khng kh c th? b? m?c k?t. ?i?u ny c th? cho php vi khu?n v vi trng khc pht tri?n v gy nhi?m trng.   Vim xoang c th? pht tri?n m?t cch nhanh chng v ko di trong m?t th?i gian ng?n (c?p tnh) ho?c ti?p t?c trong th?i gian di (mn tnh). Vim xoang ko di h?n 12 tu?n ???c coi l mn tnh.  NGUYN NHN  Nguyn nhn vim xoang bao g?m:   D? ?ng.  Di d?ng k?t c?u, ch?ng h?n nh? d?ch chuy?n c?a s?n phn cch l? m?i (l?ch vch ng?n), c th? lm gi?m lu?ng khng kh qua m?i c?ng nh? cc xoang v ?nh h??ng ??n kh? n?ng thot c?a xoang.  D? d?ng ch?c n?ng, ch?ng h?n nh? khi cc s?i lng nh? (mao) ph? cc xoang v gip lo?i b? d?ch nh?y khng ho?t ??ng ?ng ho?c khng c. TRI?U  CH?NG  Cc tri?u ch?ng c?a vim xoang c?p tnh v mn tnh ??u gi?ng nhau. Cc tri?u ch?ng chnh l ?au v p l?c xung quanh xoang b? ?nh h??ng. Cc tri?u ch?ng khc bao g?m:   ?au r?ng trn.  ?au tai.  ?au ??u.  H?i th? hi.  Suy gi?m thnh gic v v? gic.  Ho n?ng h?n khi n?m.  M?t m?i.  S?t.  R? d?ch ??c t? m?i, th??ng c mu xanh v c th? ch?a m?.  S?ng v ?m h?n ? cc xoang b? ?nh h??ng. CH?N ?ON  Chuyn gia ch?m [place] y t? s? khm tr?c ti?p. Trong qu trnh xt nghi?m, chuyn gia ch?m [place] y t? c th?:   Soi m?i c?a b?n xem c cc d?u hi?u c?a s? pht tri?n b?t th??ng trong l? m?i (polyp m?i) khng.  G vo cc xoang b? ?nh h??ng ?? ki?m tra d?u hi?u nhi?m trng.  Xem bn trong cc xoang (n?i soi) b?ng m?t thi?t b? hnh ?nh ??c bi?t c g?n ?n (n?i soi) ???c ??a vo xoang. N?u chuyn gia ch?m [place] y t? nghi ng? r?ng b?n b? vim xoang mn tnh, m?t ho?c nhi?u xt nghi?m sau ?y c th? ???c ?? ngh?:   Xt nghi?m d? ?ng.  L?y m?u c?y m?i-M?t m?u d?ch nh?y ???c l?y t? m?i c?a b?n v g?i ??n phng th nghi?m ?? ki?m tra vi khu?n.  T? bo h?c m?i-M?t m?u d?ch nh?y ???c l?y t? m?i c?a b?n v xt nghi?m b?i chuyn gia ch?m [place] y t? ?? xc ??nh xem tnh tr?ng vim xoang c?a b?n c lin quan ??n d? ?ng hay khng. ?I?U TR?  H?u h?t cc tr??ng h?p vim xoang c?p tnh c lin quan ??n nhi?m vi rt v s? t? kh?i trong vng 10 ngy. ?i khi thu?c ???c ch? ??nh ?? gip lm gi?m cc tri?u ch?ng (thu?c gi?m ?au, thu?c thng m?i, thu?c x?t m?i steroid ho?c bnh x?t n??c mu?i).  Tuy nhin, v?i vim  xoang lin quan ??n nhi?m vi khu?n, chuyn gia ch?m [place] y t? s? k thu?c khng sinh. ?y l nh?ng lo?i thu?c s? gip tiu di?t vi khu?n gy nhi?m trng.  Trong tr??ng h?p hi?m g?p, vim xoang gy b?i nhi?m trng do n?m. Trong nh?ng tr??ng h?p ny, chuyn gia ch?m [place] y t? s? k thu?c khng n?m.  M?t s? tr??ng h?p vim xoang mn tnh s? c?n ph?u thu?t. Ni chung, ?y l nh?ng tr??ng h?p vim xoang  ti pht trn 3 l?n m?i n?m, m?c d ? th?c hi?n cc ph??ng php ?i?u tr? khc.  H??NG D?N CH?M [place] T?I NH   U?ng th?t nhi?u n??c. N??c gip lm long d?ch nh?y ?? xoang c th? thot d? dng h?n.  S? d?ng my t?o ?m.  Ht h?i n??c 3 ??n 4 l?n m?t ngy (v d?, ng?i trong phng t?m v?i vi sen ?ang ch?y).  ??t kh?n ?m, ?m ln m?t 3 ??n 4 l?n m?t ngy, ho?c theo ch? d?n c?a chuyn gia ch?m [place] y t?.  S? d?ng bnh x?t m?i ch?a n??c mu?i ?? gip lm ?m v lm s?ch xoang.  Ch? s? d?ng thu?c mua tr?c ti?p t?i hi?u thu?c ho?c thu?c theo toa ?? gi?m ?au, gi?m s? kh ch?u ho?c h? s?t theo ch? d?n c?a chuyn gia ch?m  y t? c?a b?n. HY NGAY L?P T?C THAM V?N V?I CHUYN GIA Y T? N?U:   B?n b? ?au gia t?ng ho?c ?au ??u n?ng.  B?n b? bu?n nn, nn m?a ho?c bu?n ng?.  B?n b? s?ng xung quanh m?t.  B?n c v?n ?? v? th? l?c.  B?n b? c?ng c?.  B?n b? kh th?. ??M B?O B?N:   Hi?u cc h??ng d?n ny.  S? theo di tnh tr?ng c?a mnh.  S? yu c?u tr? gip ngay l?p t?c n?u b?n c?m th?y khng kh?e ho?c tnh tr?ng tr? nn t?i h?n. Document Released: 08/09/2011 [facility] Patient Information 2013 [place], [place].

## 2012-08-04 ENCOUNTER — Ambulatory Visit: Payer: 59

## 2012-08-04 ENCOUNTER — Other Ambulatory Visit: Payer: Self-pay | Admitting: Family Medicine

## 2012-08-04 ENCOUNTER — Ambulatory Visit (INDEPENDENT_AMBULATORY_CARE_PROVIDER_SITE_OTHER): Payer: 59 | Admitting: Family Medicine

## 2012-08-04 VITALS — BP 124/74 | HR 70 | Temp 98.0°F | Resp 16 | Ht 66.0 in | Wt 208.0 lb

## 2012-08-04 DIAGNOSIS — T148XXA Other injury of unspecified body region, initial encounter: Secondary | ICD-10-CM

## 2012-08-04 DIAGNOSIS — R1013 Epigastric pain: Secondary | ICD-10-CM

## 2012-08-04 DIAGNOSIS — M25569 Pain in unspecified knee: Secondary | ICD-10-CM

## 2012-08-04 DIAGNOSIS — K219 Gastro-esophageal reflux disease without esophagitis: Secondary | ICD-10-CM

## 2012-08-04 DIAGNOSIS — M25539 Pain in unspecified wrist: Secondary | ICD-10-CM

## 2012-08-04 LAB — POCT CBC
Granulocyte percent: 59.2 %G (ref 37–80)
HCT, POC: 41.3 % — AB (ref 43.5–53.7)
Hemoglobin: 12.6 g/dL — AB (ref 14.1–18.1)
Lymph, poc: 2.4 (ref 0.6–3.4)
MCH, POC: 23.7 pg — AB (ref 27–31.2)
MCHC: 30.5 g/dL — AB (ref 31.8–35.4)
MCV: 77.7 fL — AB (ref 80–97)
MID (cbc): 0.4 (ref 0–0.9)
MPV: 10.7 fL (ref 0–99.8)
POC Granulocyte: 4 (ref 2–6.9)
POC LYMPH PERCENT: 34.8 %L (ref 10–50)
POC MID %: 6 % (ref 0–12)
Platelet Count, POC: 180 10*3/uL (ref 142–424)
RBC: 5.31 M/uL (ref 4.69–6.13)
RDW, POC: 14.7 %
WBC: 6.8 10*3/uL (ref 4.6–10.2)

## 2012-08-04 MED ORDER — MELOXICAM 7.5 MG PO TABS: 7.5000 mg | ORAL_TABLET | Freq: Every day | ORAL | Status: DC

## 2012-08-04 MED ORDER — CYCLOBENZAPRINE HCL 5 MG PO TABS: 5.0000 mg | ORAL_TABLET | Freq: Every evening | ORAL | Status: DC | PRN

## 2012-08-04 MED ORDER — METHYLPREDNISOLONE 4 MG PO KIT: PACK | ORAL | Status: DC

## 2012-08-04 NOTE — Patient Instructions (Signed)
Gastroesophageal Reflux Disease, Adult  Gastroesophageal reflux disease (GERD) happens when acid from your stomach flows up into the esophagus. When acid comes in contact with the esophagus, the acid causes soreness (inflammation) in the esophagus. Over time, GERD may create small holes (ulcers) in the lining of the esophagus.  CAUSES   · Increased body weight. This puts pressure on the stomach, making acid rise from the stomach into the esophagus.  · Smoking. This increases acid production in the stomach.  · Drinking alcohol. This causes decreased pressure in the lower esophageal sphincter (valve or ring of muscle between the esophagus and stomach), allowing acid from the stomach into the esophagus.  · Late evening meals and a full stomach. This increases pressure and acid production in the stomach.  · A malformed lower esophageal sphincter.  Sometimes, no cause is found.  SYMPTOMS   · Burning pain in the lower part of the mid-chest behind the breastbone and in the mid-stomach area. This may occur twice a week or more often.  · Trouble swallowing.  · Sore throat.  · Dry cough.  · Asthma-like symptoms including chest tightness, shortness of breath, or wheezing.  DIAGNOSIS   Your caregiver may be able to diagnose GERD based on your symptoms. In some cases, X-rays and other tests may be done to check for complications or to check the condition of your stomach and esophagus.  TREATMENT   Your caregiver may recommend over-the-counter or prescription medicines to help decrease acid production. Ask your caregiver before starting or adding any new medicines.   HOME CARE INSTRUCTIONS   · Change the factors that you can control. Ask your caregiver for guidance concerning weight loss, quitting smoking, and alcohol consumption.  · Avoid foods and drinks that make your symptoms worse, such as:  · Caffeine or alcoholic drinks.  · Chocolate.  · Peppermint or mint flavorings.  · Garlic and onions.  · Spicy foods.  · Citrus fruits,  such as oranges, lemons, or limes.  · Tomato-based foods such as sauce, chili, salsa, and pizza.  · Fried and fatty foods.  · Avoid lying down for the 3 hours prior to your bedtime or prior to taking a nap.  · Eat small, frequent meals instead of large meals.  · Wear loose-fitting clothing. Do not wear anything tight around your waist that causes pressure on your stomach.  · Raise the head of your bed 6 to 8 inches with wood blocks to help you sleep. Extra pillows will not help.  · Only take over-the-counter or prescription medicines for pain, discomfort, or fever as directed by your caregiver.  · Do not take aspirin, ibuprofen, or other nonsteroidal anti-inflammatory drugs (NSAIDs).  SEEK IMMEDIATE MEDICAL CARE IF:   · You have pain in your arms, neck, jaw, teeth, or back.  · Your pain increases or changes in intensity or duration.  · You develop nausea, vomiting, or sweating (diaphoresis).  · You develop shortness of breath, or you faint.  · Your vomit is green, yellow, black, or looks like coffee grounds or blood.  · Your stool is red, bloody, or black.  These symptoms could be signs of other problems, such as heart disease, gastric bleeding, or esophageal bleeding.  MAKE SURE YOU:   · Understand these instructions.  · Will watch your condition.  · Will get help right away if you are not doing well or get worse.  Document Released: 11/17/2004 Document Revised: 05/02/2011 Document Reviewed: 08/27/2010  ExitCare® Patient   Information ©2014 [employer].  Diet for Gastroesophageal Reflux Disease, Adult  Reflux (acid reflux) is when acid from your stomach flows up into the esophagus. When acid comes in contact with the esophagus, the acid causes irritation and soreness (inflammation) in the esophagus. When reflux happens often or so severely that it causes damage to the esophagus, it is called gastroesophageal reflux disease (GERD). Nutrition therapy can help ease the discomfort of GERD.  FOODS OR DRINKS TO AVOID OR  LIMIT  · Smoking or chewing tobacco. Nicotine is one of the most potent stimulants to acid production in the gastrointestinal tract.  · Caffeinated and decaffeinated coffee and black tea.  · Regular or low-calorie carbonated beverages or energy drinks (caffeine-free carbonated beverages are allowed).    · Strong spices, such as black pepper, white pepper, red pepper, cayenne, curry powder, and chili powder.  · Peppermint or spearmint.  · Chocolate.  · High-fat foods, including meats and fried foods. Extra added fats including oils, butter, salad dressings, and nuts. Limit these to less than 8 tsp per day.  · Fruits and vegetables if they are not tolerated, such as citrus fruits or tomatoes.  · Alcohol.  · Any food that seems to aggravate your condition.  If you have questions regarding your diet, call your caregiver or a registered dietitian.  OTHER THINGS THAT MAY HELP GERD INCLUDE:   · Eating your meals slowly, in a relaxed setting.  · Eating 5 to 6 small meals per day instead of 3 large meals.  · Eliminating food for a period of time if it causes distress.  · Not lying down until 3 hours after eating a meal.  · Keeping the head of your bed raised 6 to 9 inches (15 to 23 cm) by using a foam wedge or blocks under the legs of the bed. Lying flat may make symptoms worse.  · Being physically active. Weight loss may be helpful in reducing reflux in overweight or obese adults.  · Wear loose fitting clothing  EXAMPLE MEAL PLAN  This meal plan is approximately 2,000 calories based on ChooseMyPlate.gov meal planning guidelines.  Breakfast  · ½ cup cooked oatmeal.  · 1 cup strawberries.  · 1 cup low-fat milk.  · 1 oz almonds.  Snack  · 1 cup cucumber slices.  · 6 oz yogurt (made from low-fat or fat-free milk).  Lunch  · 2 slice whole-wheat bread.  · 2½ oz sliced turkey.  · 2 tsp mayonnaise.  · 1 cup blueberries.  · 1 cup snap peas.  Snack  · 6 whole-wheat crackers.  · 1 oz string cheese.  Dinner  · ½ cup brown rice.  · 1  cup mixed veggies.  · 1 tsp olive oil.  · 3 oz grilled fish.  Document Released: 02/07/2005 Document Revised: 05/02/2011 Document Reviewed: 12/24/2010  ExitCare® Patient Information ©2014 [employer].

## 2012-08-04 NOTE — Progress Notes (Signed)
[facility]:  Office Visit  Chief Complaint:  Chief Complaint  Patient presents with  . Gastrophageal Reflux    x 2 weeks  hand and foot pain x 2 months   back pain since this am    HPI: Joshua Garrett is a 47 y.o. male who complains of :  1. 2 week history of bitter tast in his mouth, does not smoke, does not eat a lot of tomato, does not drink alcohol. Nothing makes it worse or better. When he works, he gets the bitter taste. Still able to eat and drink ok. He eats at 6-7 at night, goes to sleep at 9-11. Has not had sxs like this before. He has not tried anything for this.   2. He has  Bilateral wrist pain x 2 months. NKI. 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 He also has back pain that has been chronic, this is a flare up. He denies numbness or tingling but does have pain with cetainmotions specifically when her grips or bangs on a hammer.   History reviewed. No pertinent past medical history. History reviewed. No pertinent past surgical history. History   Social History  . Marital Status: Married    Spouse Name: N/A    Number of Children: N/A  . Years of Education: N/A   Social History Main Topics  . Smoking status: Former Smoker    Quit date: 02/22/1987  . Smokeless tobacco: Never Used  . Alcohol Use: None  . Drug Use: No  . Sexually Active: Yes   Other Topics Concern  . None   Social History Narrative  . None   History reviewed. No pertinent family history. No Known Allergies Prior to Admission medications   Medication Sig Start Date End Date Taking? Authorizing Provider  amoxicillin-clavulanate (AUGMENTIN) 875-125 MG per tablet Take 1 tablet by mouth 2 (two) times daily. 06/25/12   Phillips Odor, MD  meloxicam (MOBIC) 15 MG tablet Take 1 tablet (15 mg total) by mouth daily. 06/25/12   Phillips Odor, MD  pseudoephedrine-guaifenesin [facility] D) 60-600 MG per tablet Take 1 tablet by mouth every 12 (twelve) hours. 06/25/12 06/25/13  Phillips Odor, MD     ROS: The patient denies fevers, chills, night sweats, unintentional weight loss, chest pain, palpitations, wheezing, dyspnea on exertion, nausea, vomiting, abdominal pain, dysuria, hematuria, melena, numbness, weakness, or tingling.   All other systems have been reviewed and were otherwise negative with the exception of those mentioned in the HPI and as above.    PHYSICAL EXAM: Filed Vitals:   08/04/12 1654  BP: 124/74  Pulse: 70  Temp: 98 F (36.7 C)  Resp: 16   Filed Vitals:   08/04/12 1654  Height: 5\' 6"  (1.676 m)  Weight: 208 lb (94.348 kg)   Body mass index is 33.59 kg/(m^2).  General: Alert, no acute distress HEENT:  Normocephalic, atraumatic, oropharynx patent.  Cardiovascular:  Regular rate and rhythm, no rubs murmurs or gallops.  No Carotid bruits, radial pulse intact. No pedal edema.  Respiratory: Clear to auscultation bilaterally.  No wheezes, rales, or rhonchi.  No cyanosis, no use of accessory musculature GI: No organomegaly, abdomen is soft and non-tender, positive bowel sounds.  No masses. Skin: No rashes. Neurologic: Facial musculature symmetric. Psychiatric: Patient is appropriate throughout our interaction. Lymphatic: No cervical lymphadenopathy Musculoskeletal: Gait intact. Bilateral wrist-full ROM, 5/5 strength, sensation intact , interosseuss msk 5/5 Neg phalens, , neg Dequervains + pain along extensor retinaculum, extensors  msk and also brachoradialus about halfway up forearm when he is gripping, and also doing a banging motion with his wrist   LABS:    EKG/XRAY:   Primary read interpreted by Dr. Conley Rolls at [facility]. No fx or dislocation bialteral wrist Normal abdomen  ASSESSMENT/PLAN: Encounter Diagnoses  Name Primary?  . Wrist pain, unspecified laterality Yes  . Abdominal pain, epigastric   . GERD (gastroesophageal reflux disease)   . Sprain and strain    Pleasant 47 y/o [place] male who is here with his wife and daughter.  English is limited even when daughter is translating. Sounds like repetitive motion injury resulting in bialteral tendosynovitis We tried fitting him with 2 wrist braces but when he did the same motions that would exacerbate his pain there was no relief so patient did not want braces Rx Medrol dose pack, flexeril and mobic.  Rx PPI for GERD sxs F/u prn Labs pending.     Aleeah Greeno PHUONG, DO 08/06/2012 7:18 AM

## 2012-08-05 LAB — COMPREHENSIVE METABOLIC PANEL WITH GFR
ALT: 29 U/L (ref 0–53)
AST: 32 U/L (ref 0–37)
Albumin: 4.6 g/dL (ref 3.5–5.2)
Alkaline Phosphatase: 49 U/L (ref 39–117)
BUN: 18 mg/dL (ref 6–23)
CO2: 28 meq/L (ref 19–32)
Calcium: 8.6 mg/dL (ref 8.4–10.5)
Chloride: 104 meq/L (ref 96–112)
Creat: 1.22 mg/dL (ref 0.50–1.35)
Glucose, Bld: 84 mg/dL (ref 70–99)
Potassium: 3.5 meq/L (ref 3.5–5.3)
Sodium: 139 meq/L (ref 135–145)
Total Bilirubin: 0.5 mg/dL (ref 0.3–1.2)
Total Protein: 6.8 g/dL (ref 6.0–8.3)

## 2012-08-05 LAB — LIPASE: Lipase: 15 U/L (ref 0–75)

## 2012-08-06 LAB — H. PYLORI ANTIBODY, IGG: H Pylori IgG: 1.12 {ISR} — ABNORMAL HIGH

## 2012-08-08 LAB — HELICOBACTER PYLORI  ANTIBODY, IGM: Helicobacter pylori, IgM: 1.3 U/mL (ref <9.0)

## 2012-09-10 ENCOUNTER — Ambulatory Visit (INDEPENDENT_AMBULATORY_CARE_PROVIDER_SITE_OTHER): Payer: 59 | Admitting: Physician Assistant

## 2012-09-10 VITALS — BP 110/84 | HR 79 | Temp 99.3°F | Resp 17 | Wt 207.0 lb

## 2012-09-10 DIAGNOSIS — J069 Acute upper respiratory infection, unspecified: Secondary | ICD-10-CM

## 2012-09-10 MED ORDER — GUAIFENESIN ER 1200 MG PO TB12: 1.0000 | ORAL_TABLET | Freq: Two times a day (BID) | ORAL | Status: DC | PRN

## 2012-09-10 MED ORDER — IPRATROPIUM BROMIDE 0.03 % NA SOLN: 2.0000 | Freq: Two times a day (BID) | NASAL | Status: DC

## 2012-09-10 MED ORDER — BENZONATATE 100 MG PO CAPS: 100.0000 mg | ORAL_CAPSULE | Freq: Three times a day (TID) | ORAL | Status: DC | PRN

## 2012-09-10 NOTE — Progress Notes (Signed)
  Subjective:    Patient ID: Joshua Garrett, male    DOB: [birth year], 47 y.o.   MRN: [id number]  HPI This 47 y.o. male presents for evaluation of URI-type symptoms x several days.  He is accompanied by his adolescent son, who translates, but there remains a considerable language barrier.  Congestion of the nose and face.  No ear pain, but pressure and popping.  Sore throat.  Non-productive cough.  No fever/chills.  No GI/GU symptoms.  Some muscle aches.  No rash.  Past medical history, surgical history, family history, social history and problem list reviewed.    Review of Systems As above.    Objective:   Physical Exam Blood pressure 110/84, pulse 79, temperature 99.3 F (37.4 C), temperature source Oral, resp. rate 17, weight 207 lb (93.895 kg), SpO2 98.00%. Body mass index is 33.43 kg/(m^2). Well-developed, well nourished [place] male who is awake, alert and oriented, in NAD. HEENT: [place]/AT, PERRL, EOMI.  Sclera and conjunctiva are clear.  EAC are patent, TMs are normal in appearance. Nasal mucosa is pink, congested and moist. OP is clear. Neck: supple, non-tender, no lymphadenopathy, thyromegaly. Heart: RRR, no murmur Lungs: normal effort, CTA Extremities: no cyanosis, clubbing or edema. Skin: warm and dry without rash. Psychologic: good mood and appropriate affect, normal speech and behavior.        Assessment & Plan:  Viral URI with cough - Plan: Guaifenesin (MUCINEX MAXIMUM STRENGTH) 1200 MG TB12, ipratropium (ATROVENT) 0.03 % nasal spray, benzonatate (TESSALON) 100 MG capsule  Supportive care. Anticipatory guidance.  RTC if symptoms worsen or persist.  Fernande Bras, PA-C Physician Assistant-Certified [facility]

## 2012-09-10 NOTE — Patient Instructions (Signed)
Get plenty of rest and drink at least 64 ounces of water daily. Use over-the-counter ibuprofen or acetaminophen as needed for pain or fever.

## 2013-01-10 ENCOUNTER — Ambulatory Visit (INDEPENDENT_AMBULATORY_CARE_PROVIDER_SITE_OTHER): Payer: 59 | Admitting: Family Medicine

## 2013-01-10 VITALS — BP 134/78 | HR 100 | Temp 99.0°F | Resp 18 | Ht 65.75 in | Wt 202.0 lb

## 2013-01-10 DIAGNOSIS — J029 Acute pharyngitis, unspecified: Secondary | ICD-10-CM

## 2013-01-10 DIAGNOSIS — R05 Cough: Secondary | ICD-10-CM

## 2013-01-10 DIAGNOSIS — R059 Cough, unspecified: Secondary | ICD-10-CM

## 2013-01-10 DIAGNOSIS — D649 Anemia, unspecified: Secondary | ICD-10-CM

## 2013-01-10 DIAGNOSIS — R1013 Epigastric pain: Secondary | ICD-10-CM

## 2013-01-10 LAB — POCT CBC
HCT, POC: 41.3 % — AB (ref 43.5–53.7)
Lymph, poc: 2.1 (ref 0.6–3.4)
MCHC: 30.3 g/dL — AB (ref 31.8–35.4)
POC Granulocyte: 5.5 (ref 2–6.9)
POC LYMPH PERCENT: 25.7 %L (ref 10–50)
POC MID %: 5.7 %M (ref 0–12)
RDW, POC: 15.1 %

## 2013-01-10 LAB — POCT INFLUENZA A/B: Influenza A, POC: NEGATIVE

## 2013-01-10 MED ORDER — AMOXICILLIN 500 MG PO TABS: 1000.0000 mg | ORAL_TABLET | Freq: Two times a day (BID) | ORAL | Status: DC

## 2013-01-10 MED ORDER — OMEPRAZOLE 20 MG PO CPDR: 20.0000 mg | DELAYED_RELEASE_CAPSULE | Freq: Every day | ORAL | Status: DC

## 2013-01-10 NOTE — Progress Notes (Addendum)
[address]   [place], [place]  [address]   [phone] Subjective:    Patient ID: Joshua Garrett, male    DOB: 09-25-1965, 47 y.o.   MRN: [id number]  This chart was scribed for Ethelda Chick, MD by Blanchard Kelch, ED Scribe. The patient was seen in room 12. Patient's care was started at 6:17 PM.  HPI  Joshua Garrett is a 47 y.o. male who presents to office complaining of constant, bilateral sore throat that began two days ago. The pain is worsened with talking and swallowing. He states that he has had associated rhinorrhea, cough, fever and chills with the sore throat. He denies diaphoresis, vomiting, or diarrhea. He lives alone so denies sick contacts at home. He denies smoking.  He is also reporting constant epigastric abdominal pain that has been going on for awhile. He denies noticing any red blood in his stool, but states that he sometimes has black stool.  Denies n/v/d.   He is also complaining of mild tightness in his hands and feet bilaterally. The pain is relieved when he "pops" them.   History reviewed. No pertinent past medical history. History reviewed. No pertinent past surgical history. History reviewed. No pertinent family history. No Known Allergies Current Outpatient Prescriptions on File Prior to Visit  Medication Sig Dispense Refill   benzonatate (TESSALON) 100 MG capsule Take 1-2 capsules (100-200 mg total) by mouth 3 (three) times daily as needed for cough.  40 capsule  0   Guaifenesin (MUCINEX MAXIMUM STRENGTH) 1200 MG TB12 Take 1 tablet (1,200 mg total) by mouth every 12 (twelve) hours as needed.  14 tablet  1   ipratropium (ATROVENT) 0.03 % nasal spray Place 2 sprays into the nose 2 (two) times daily.  30 mL  0   No current facility-administered medications on file prior to visit.   History   Social History   Marital Status: Married    Spouse Name: N/A    Number of Children: 4   Years of Education: N/A   Occupational History   Youth worker    Social  History Main Topics   Smoking status: Former Smoker    Quit date: 02/22/1987   Smokeless tobacco: Never Used   Alcohol Use: No   Drug Use: No   Sexual Activity: Yes   Other Topics Concern   Not on file   Social History Narrative   Originally from [place].  Came to the [place] 09/03/2003. Lives with his wife and 4 children.     Review of Systems  Constitutional: Positive for fever and chills. Negative for diaphoresis.  HENT: Positive for rhinorrhea and sore throat. Negative for drooling.   Eyes: Negative for discharge.  Respiratory: Positive for cough.   Cardiovascular: Negative for leg swelling.  Gastrointestinal: Positive for abdominal pain. Negative for vomiting and diarrhea.  Genitourinary: Negative for hematuria.  Musculoskeletal: Negative for gait problem.  Skin: Negative for rash.  Neurological: Negative for speech difficulty.  Hematological: Negative for adenopathy.  Psychiatric/Behavioral: Negative for confusion.       Objective:   Physical Exam  Nursing note and vitals reviewed. Constitutional: He is oriented to person, place, and time. He appears well-developed and well-nourished. No distress.  HENT:  Head: Normocephalic and atraumatic.  Right Ear: Tympanic membrane, external ear and ear canal normal.  Left Ear: Tympanic membrane, external ear and ear canal normal.  Mouth/Throat: Uvula is midline. Posterior oropharyngeal erythema present. No oropharyngeal exudate.  Eyes: Conjunctivae and EOM are normal.  No scleral icterus.  Neck: Neck supple. No tracheal deviation present.  Cardiovascular: Normal rate and regular rhythm.   No murmur heard. Pulmonary/Chest: Effort normal and breath sounds normal. No respiratory distress. He has no wheezes. He has no rhonchi. He has no rales.  Abdominal: Soft. Bowel sounds are normal. He exhibits no distension and no mass. There is tenderness. There is no rebound and no guarding.  Tender to palpation epigastric region.    Genitourinary: Rectal exam shows no mass.  Musculoskeletal: Normal range of motion.  Lymphadenopathy:    He has cervical adenopathy.  Neurological: He is alert and oriented to person, place, and time.  Skin: Skin is warm and dry.  Psychiatric: He has a normal mood and affect. His behavior is normal.    Results for orders placed in visit on 01/10/13  POCT RAPID STREP A (OFFICE)      Result Value Range   Rapid Strep A Screen Negative  Negative  POCT INFLUENZA A/B      Result Value Range   Influenza A, POC Negative     Influenza B, POC Negative    POCT CBC      Result Value Range   WBC 8.0  4.6 - 10.2 K/uL   Lymph, poc 2.1  0.6 - 3.4   POC LYMPH PERCENT 25.7  10 - 50 %L   MID (cbc) 0.5  0 - 0.9   POC MID % 5.7  0 - 12 %M   POC Granulocyte 5.5  2 - 6.9   Granulocyte percent 68.6  37 - 80 %G   RBC 5.40  4.69 - 6.13 M/uL   Hemoglobin 12.5 (*) 14.1 - 18.1 g/dL   HCT, POC 40.9 (*) 81.1 - 53.7 %   MCV 76.4 (*) 80 - 97 fL   MCH, POC 23.1 (*) 27 - 31.2 pg   MCHC 30.3 (*) 31.8 - 35.4 g/dL   RDW, POC 91.4     Platelet Count, POC 156  142 - 424 K/uL   MPV 10.1  0 - 99.8 fL   Daughter H Ri-An Marga Hoots  [phone]    Assessment & Plan:  Cough - Plan: POCT rapid strep A, POCT Influenza A/B, POCT CBC, Culture, Group A Strep  Acute pharyngitis - Plan: POCT rapid strep A, POCT Influenza A/B, POCT CBC, Culture, Group A Strep  Anemia - Plan: IFOBT POC (occult bld, rslt in office), Iron and TIBC  1. Acute pharyngitis;  New.  Rapid strep and flu swabs negative; due to language barrier, empirically treat with Amoxicillin.  RTC inability to swallow. 2.  Abdominal pain epigastric region: New. Associated with anemia.  Rx for Omeprazole provided. Hemosure negative in office. 3. Anemia;  New.  Obtain labs.   Meds ordered this encounter  Medications   DISCONTD: amoxicillin (AMOXIL) 500 MG tablet    Sig: Take 2 tablets (1,000 mg total) by mouth 2 (two) times daily.    Dispense:  40 tablet     Refill:  0   DISCONTD: omeprazole (PRILOSEC) 20 MG capsule    Sig: Take 1 capsule (20 mg total) by mouth daily.    Dispense:  30 capsule    Refill:  3    I personally performed the services described in this documentation, which was scribed in my presence.  The recorded information has been reviewed and is accurate.  Nilda Simmer, M.D.  [facility] [address] [place], [place]  [address] [phone]  [phone] phone [phone] fax

## 2013-01-11 LAB — IRON AND TIBC
Iron: 40 ug/dL — ABNORMAL LOW (ref 42–165)
TIBC: 240 ug/dL (ref 215–435)
UIBC: 200 ug/dL (ref 125–400)

## 2013-01-13 LAB — CULTURE, GROUP A STREP: Organism ID, Bacteria: NORMAL

## 2013-01-21 ENCOUNTER — Telehealth: Payer: Self-pay

## 2013-01-21 NOTE — Telephone Encounter (Signed)
Called and spoke to patients daughter in regards to lab results. She expressed understanding that he should start OTC iron supplement as well as follow up for recheck of iron.

## 2013-01-21 NOTE — Telephone Encounter (Signed)
Patient returning call about lab results.  [phone]

## 2013-02-03 ENCOUNTER — Ambulatory Visit: Payer: 59

## 2013-02-03 ENCOUNTER — Ambulatory Visit (INDEPENDENT_AMBULATORY_CARE_PROVIDER_SITE_OTHER): Payer: 59 | Admitting: Family Medicine

## 2013-02-03 VITALS — BP 122/74 | HR 74 | Temp 98.5°F | Resp 17 | Ht 66.0 in | Wt 205.0 lb

## 2013-02-03 DIAGNOSIS — S335XXA Sprain of ligaments of lumbar spine, initial encounter: Secondary | ICD-10-CM

## 2013-02-03 DIAGNOSIS — D509 Iron deficiency anemia, unspecified: Secondary | ICD-10-CM

## 2013-02-03 DIAGNOSIS — M545 Low back pain, unspecified: Secondary | ICD-10-CM

## 2013-02-03 DIAGNOSIS — R1013 Epigastric pain: Secondary | ICD-10-CM

## 2013-02-03 LAB — POCT CBC
Granulocyte percent: 51.6 %G (ref 37–80)
HCT, POC: 47 % (ref 43.5–53.7)
Hemoglobin: 14.2 g/dL (ref 14.1–18.1)
POC Granulocyte: 3.1 (ref 2–6.9)
POC LYMPH PERCENT: 43.9 %L (ref 10–50)
RBC: 6.1 M/uL (ref 4.69–6.13)
WBC: 6.1 10*3/uL (ref 4.6–10.2)

## 2013-02-03 MED ORDER — PANTOPRAZOLE SODIUM 40 MG PO TBEC: 40.0000 mg | DELAYED_RELEASE_TABLET | Freq: Every day | ORAL | Status: DC

## 2013-02-03 MED ORDER — MELOXICAM 15 MG PO TABS: 15.0000 mg | ORAL_TABLET | Freq: Every day | ORAL | Status: DC

## 2013-02-03 NOTE — Patient Instructions (Addendum)
1.  CONTINUE IRON TABLET DAILY FOR ONE MONTH. 2.  STOP OMEPRAZOLE AND START PANTOPRAZOLE 40MG  ONE TABLET DAILY FOR STOMACH BURNING. 3.  START MELOXICAM 15MG  ONE TABLET DAILY FOR LOW BACK PAIN.

## 2013-02-03 NOTE — Progress Notes (Addendum)
Subjective:  This chart was scribed for Nilda Simmer, MD by Carl Best, Medical Scribe. This patient was seen in Room 11 and the patient's care was started at 10:54 AM.   Patient ID: Joshua Garrett, male    DOB: [birth year], 47 y.o.   MRN: [id number]  HPI HPI Comments: This encounter was translated by the patient's daughter.    Joshua Garrett is a 47 y.o. male who [facility] for a follow-up appointment regarding his epigastric pain.  The patient was seen at [facility] on January 10, 2013 for a URI and Anemia with epigastric abdominal pain.  He was treated with Amoxicillin for his URI and also started Omeprazole daily for epigastric pain.  He was referred to GI because of the Anemia and epigastric pain and was also started on Ferrous Sulfate for iron deficiency.  He was hemoccult negative in the office and his hemoglobin was 12.5 on that visit.    He states that he is still experiencing the same burning epigastric pain and the severity has not changed.  He denies otalgia, hematochezia, nausea, and emesis as associated symptoms.  He lists diarrhea and melena as associated symptoms.  He states that he will have episodes of diarrhea intermittently.  He states that he is taking the Omeprazole and Ferrous Sulfate daily.  He states that he needs a refill of his Omeprazole.  He denies seeing or contacting a GI physician since his last visit.  The patient's daughter states that she would like the patient to see a GI specialist.    He states that his congestion has improved.  He states he still feels like there is something in his throat.    He is also complaining of constant lower back pain radiating to his legs bilaterally that is aggravated by cold weather and lifting heavy objects.  He was seen in June 2013 by Dr. Cleta Alberts at [facility] and had an x-ray done of his back.  He states that he was prescribed Prednisone for his symptoms.  He was later prescribed Meloxicam and Flexeril and  experienced complete relief to his back pain.  He states that he has now run out of medication and his back pain returned 3 days ago.  The patient's daughter states that the patient smoked many years ago.  His job is very physical and he lifting a lot at work.   No past medical history on file. No past surgical history on file. No family history on file. History   Social History  . Marital Status: Married    Spouse Name: N/A    Number of Children: 4  . Years of Education: N/A   Occupational History  . Manual Labor    Social History Main Topics  . Smoking status: Former Smoker    Quit date: 02/22/1987  . Smokeless tobacco: Never Used  . Alcohol Use: No  . Drug Use: No  . Sexual Activity: Yes   Other Topics Concern  . Not on file   Social History Narrative   Originally from [place].  Came to the [place] 09/03/2003. Lives with his wife and 4 children.   No Known Allergies   Review of Systems  Constitutional: Negative for fever, chills, diaphoresis and fatigue.  HENT: Negative for ear pain and sore throat.   Gastrointestinal: Positive for abdominal pain (epigastric) and diarrhea. Negative for nausea, vomiting, constipation and blood in stool.  Musculoskeletal: Positive for back pain and myalgias.  Skin: Negative for  rash.  Neurological: Negative for weakness and numbness.  Hematological: Negative for adenopathy. Does not bruise/bleed easily.  All other systems reviewed and are negative.     Objective:  Physical Exam  Nursing note and vitals reviewed. Constitutional: He is oriented to person, place, and time. He appears well-developed and well-nourished. No distress.  HENT:  Head: Normocephalic and atraumatic.  Right Ear: External ear normal.  Left Ear: External ear normal.  Mouth/Throat: Oropharynx is clear and moist. No oropharyngeal exudate.  Eyes: Conjunctivae and EOM are normal. Pupils are equal, round, and reactive to light.  Neck: Normal range of motion. Neck  supple. No thyromegaly present.  Cardiovascular: Normal rate, regular rhythm and normal heart sounds.  Exam reveals no gallop and no friction rub.   No murmur heard. Pulmonary/Chest: Effort normal and breath sounds normal. No respiratory distress. He has no wheezes. He has no rales.  Abdominal: Soft. Bowel sounds are normal. He exhibits no distension and no mass. There is tenderness in the epigastric area. There is no rebound and no guarding.  Musculoskeletal:       Lumbar back: He exhibits normal range of motion, no tenderness, no bony tenderness, no pain and no spasm.  Lumbar spine:  Full ROM lumbar spine; no pain with flexion/extension/rotating/lateral bending. Straight leg raises negative; motor 5/5 BLE; toe and heel walking intact; marching intact.  Lymphadenopathy:    He has no cervical adenopathy.  Neurological: He is alert and oriented to person, place, and time. No cranial nerve deficit.  Psychiatric: He has a normal mood and affect. His behavior is normal.   Results for orders placed in visit on 02/03/13  POCT CBC      Result Value Range   WBC 6.1  4.6 - 10.2 K/uL   Lymph, poc 2.7  0.6 - 3.4   POC LYMPH PERCENT 43.9  10 - 50 %L   MID (cbc) 0.3  0 - 0.9   POC MID % 4.5  0 - 12 %M   POC Granulocyte 3.1  2 - 6.9   Granulocyte percent 51.6  37 - 80 %G   RBC 6.10  4.69 - 6.13 M/uL   Hemoglobin 14.2  14.1 - 18.1 g/dL   HCT, POC 96.0  45.4 - 53.7 %   MCV 77.0 (*) 80 - 97 fL   MCH, POC 23.3 (*) 27 - 31.2 pg   MCHC 30.2 (*) 31.8 - 35.4 g/dL   RDW, POC 09.8     Platelet Count, POC 191  142 - 424 K/uL   MPV 9.9  0 - 99.8 fL   [facility] reading (PRIMARY) by  Dr. Katrinka Blazing. Lumbar films:  Mild degenerative changes; no acute process.    Assessment & Plan:  Anemia, iron deficiency - Plan: POCT CBC  Lower back pain - Plan: DG Lumbar Spine Complete  Abdominal pain, epigastric  1.  Iron deficiency anemia: improving with daily iron supplement; hemoccult negative at last visit three weeks  ago; continue ferrous sulfate 325mg  daily for next six weeks and then stop. 2.  Abdominal pain epigastric: persistent; no improvement with daily Omeprazole for past three weeks; stop Omeprazole; switch to Protonix 40mg  daily; refer to GI due to iron deficiency anemia and GERD/abdominal pain epigastric.  Likely will warrant EGD. 3.  Lower back pain/lumbar strain:  New.  Rx for Meloxicam 15mg  one tablet daily PRN.  Rest, stretches, frequent ambulation.  I personally performed the services described in this documentation, which was scribed in my presence.  The recorded information has been reviewed and is accurate.

## 2013-02-05 ENCOUNTER — Ambulatory Visit (INDEPENDENT_AMBULATORY_CARE_PROVIDER_SITE_OTHER): Payer: PRIVATE HEALTH INSURANCE | Admitting: Family Medicine

## 2013-02-05 VITALS — BP 108/76 | HR 80 | Temp 98.1°F | Resp 16 | Ht 66.0 in | Wt 207.0 lb

## 2013-02-05 DIAGNOSIS — M545 Low back pain, unspecified: Secondary | ICD-10-CM

## 2013-02-05 DIAGNOSIS — R8271 Bacteriuria: Secondary | ICD-10-CM

## 2013-02-05 DIAGNOSIS — R109 Unspecified abdominal pain: Secondary | ICD-10-CM

## 2013-02-05 LAB — POCT CBC
HCT, POC: 46 % (ref 43.5–53.7)
Lymph, poc: 2.8 (ref 0.6–3.4)
MCH, POC: 23.1 pg — AB (ref 27–31.2)
MCHC: 29.8 g/dL — AB (ref 31.8–35.4)
MID (cbc): 0.5 (ref 0–0.9)
MPV: 10.3 fL (ref 0–99.8)
POC Granulocyte: 3.6 (ref 2–6.9)
POC MID %: 7.1 %M (ref 0–12)
Platelet Count, POC: 184 10*3/uL (ref 142–424)
RBC: 5.92 M/uL (ref 4.69–6.13)
WBC: 6.9 10*3/uL (ref 4.6–10.2)

## 2013-02-05 LAB — POCT UA - MICROSCOPIC ONLY
Casts, Ur, LPF, POC: NEGATIVE
Crystals, Ur, HPF, POC: NEGATIVE
Mucus, UA: POSITIVE
Yeast, UA: NEGATIVE

## 2013-02-05 LAB — POCT URINALYSIS DIPSTICK
Bilirubin, UA: NEGATIVE
Blood, UA: NEGATIVE
Ketones, UA: NEGATIVE
Protein, UA: NEGATIVE
Spec Grav, UA: 1.025
Urobilinogen, UA: 0.2

## 2013-02-05 MED ORDER — CIPROFLOXACIN HCL 500 MG PO TABS: 500.0000 mg | ORAL_TABLET | Freq: Two times a day (BID) | ORAL | Status: DC

## 2013-02-05 MED ORDER — HYDROCODONE-ACETAMINOPHEN 5-325 MG PO TABS: 1.0000 | ORAL_TABLET | Freq: Three times a day (TID) | ORAL | Status: DC | PRN

## 2013-02-05 NOTE — Patient Instructions (Signed)
We are going to treat you for back pain and for a possible kidney or bladder infection.  Take the cipro for infection- twice a day for 5 days.  Use the hydrocodone/ acetaminophen as needed for pain.  Remember it can cause drowsiness.  Do not use it when you need to drive.    I will be in touch in a couple of days with your urine culture results.  If you are getting worse in the meantime please let me know

## 2013-02-05 NOTE — Progress Notes (Addendum)
[facility] [address] [place] [address] [phone]  Date:  02/05/2013   Name:  Joshua Garrett   DOB:  28-May-1965   MRN:  [id number]  PCP:  Tally Due, MD    Chief Complaint: Back Pain   History of Present Illness:  Joshua Garrett is a 47 y.o. very pleasant male patient who presents with the following:  Seen here 2 days ago with epigastric pain, back pain.   Partial HPI from 12/14:  He is also complaining of constant lower back pain radiating to his legs bilaterally that is aggravated by cold weather and lifting heavy objects. He was seen in June 2013 by Dr. Cleta Alberts at [facility] and had an x-ray done of his back. He states that he was prescribed Prednisone for his symptoms. He was later prescribed Meloxicam and Flexeril and experienced complete relief to his back pain. He states that he has now run out of medication and his back pain returned 3 days ago. The patient's daughter states that the patient smoked many years ago. His job is very physical and he lifting a lot at work.  He had films on the 14th that were read as negative  LUMBAR SPINE - COMPLETE 4+ VIEW  COMPARISON: 08/14/2011  FINDINGS:  Five lumbar type vertebral bodies.  Normal lumbar lordosis.  No evidence of fracture dislocation. Vertebral body heights and  intervertebral disc spaces are maintained.  Very mild degenerative changes at L3-4.  Visualized bony pelvis appears intact.  IMPRESSION:  No fracture or dislocation is seen.  He was rx with mobic.  His daughter interprets for him today as he does not speak a lot of [place].  States that the pain has not gotten any better and in fact it is worse.  He has more pain on the left, less on the right.  The pain does run down the left leg.  He does not have any numbness or weakness. It hurts the most when he tries to rise from a seated position.   He was seen in 07/2011 for a similar issue.  He was treated with prednisone which did help at that time.  He  has never had an MRI.  He has not experienced any bowel or bladder incontinency.    He has also been noted to have epigastric pain recently- he has not yet seen GI but his daughter states that someone should be calling about an appt soon.    He also complains of pain in his left flank and into his left lower quadrant- he has noted this for a few days  There are no active problems to display for this patient.   No past medical history on file.  No past surgical history on file.  History  Substance Use Topics  . Smoking status: Former Smoker    Quit date: 02/22/1987  . Smokeless tobacco: Never Used  . Alcohol Use: No    No family history on file.  No Known Allergies  Medication list has been reviewed and updated.  Current Outpatient Prescriptions on File Prior to Visit  Medication Sig Dispense Refill  . meloxicam (MOBIC) 15 MG tablet Take 1 tablet (15 mg total) by mouth daily.  30 tablet  0  . omeprazole (PRILOSEC) 20 MG capsule Take 1 capsule (20 mg total) by mouth daily.  30 capsule  3  . pantoprazole (PROTONIX) 40 MG tablet Take 1 tablet (40 mg total) by mouth daily.  30 tablet  3   No current facility-administered medications on file prior to visit.    Review of Systems:  As per HPI- otherwise negative.   Physical Examination: Filed Vitals:   02/05/13 1202  BP: 108/76  Pulse: 80  Temp: 98.1 F (36.7 C)  Resp: 16   Filed Vitals:   02/05/13 1202  Height: 5\' 6"  (1.676 m)  Weight: 207 lb (93.895 kg)   Body mass index is 33.43 kg/(m^2). Ideal Body Weight: Weight in (lb) to have BMI = 25: 154.6  GEN: WDWN, NAD, Non-toxic, A & O x 3, overweight, moves stiffly due to back pain HEENT: Atraumatic, Normocephalic. Neck supple. No masses, No LAD. Ears and Nose: No external deformity. CV: RRR, No M/G/R. No JVD. No thrill. No extra heart sounds. PULM: CTA B, no wheezes, crackles, rhonchi. No retractions. No resp. distress. No accessory muscle use. ABD: S,  ND, +BS.  No rebound. No HSM.  Mild LLQ tenderness  EXTR: No c/c/e NEURO moving slowly as above PSYCH: Normally interactive. Conversant. Not depressed or anxious appearing.  Calm demeanor.  Back: stiff, difficulty with flexion.  Tenderness in the left lower back.  Positive SLR on the left.  Normal strength, sensation and DTR bilaterally  Results for orders placed in visit on 02/05/13  POCT CBC      Result Value Range   WBC 6.9  4.6 - 10.2 K/uL   Lymph, poc 2.8  0.6 - 3.4   POC LYMPH PERCENT 40.3  10 - 50 %L   MID (cbc) 0.5  0 - 0.9   POC MID % 7.1  0 - 12 %M   POC Granulocyte 3.6  2 - 6.9   Granulocyte percent 52.6  37 - 80 %G   RBC 5.92  4.69 - 6.13 M/uL   Hemoglobin 13.7 (*) 14.1 - 18.1 g/dL   HCT, POC 78.2  95.6 - 53.7 %   MCV 77.7 (*) 80 - 97 fL   MCH, POC 23.1 (*) 27 - 31.2 pg   MCHC 29.8 (*) 31.8 - 35.4 g/dL   RDW, POC 21.3     Platelet Count, POC 184  142 - 424 K/uL   MPV 10.3  0 - 99.8 fL  POCT UA - MICROSCOPIC ONLY      Result Value Range   WBC, Ur, HPF, POC 2-6     RBC, urine, microscopic 0-4     Bacteria, U Microscopic 2+     Mucus, UA pos     Epithelial cells, urine per micros 0-6     Crystals, Ur, HPF, POC neg     Casts, Ur, LPF, POC neg     Yeast, UA neg    POCT URINALYSIS DIPSTICK      Result Value Range   Color, UA yellow     Clarity, UA clear     Glucose, UA neg     Bilirubin, UA neg     Ketones, UA neg     Spec Grav, UA 1.025     Blood, UA neg     pH, UA 5.5     Protein, UA neg     Urobilinogen, UA 0.2     Nitrite, UA neg     Leukocytes, UA Negative      Assessment and Plan: Left lumbar pain - Plan: HYDROcodone-acetaminophen (NORCO/VICODIN) 5-325 MG per tablet, Comprehensive metabolic panel  Left flank pain - Plan: POCT CBC, POCT UA - Microscopic Only, POCT urinalysis dipstick, Comprehensive metabolic panel  Bacteria  in urine - Plan: Urine culture, ciprofloxacin (CIPRO) 500 MG tablet, Comprehensive metabolic panel   Joshua Garrett is here today to follow-up  back pain. Suspect he may have a bulging disc causing nerve compression. Will treat with vicodin as needed for pain.  STOP taking mobic for now as we may need to start prednisone if not improved in a few days Also noted suggestion of UTI/ pyelo on his UA.  Will start cipro and send for a culture.  CMP pending as well  Signed Abbe Amsterdam, MD  DaughterShanna Cisco- [phone]  12/23: called and discussed with his daughter Rian who spoke with her father for me. He is feeling better and just has a little pain in the left lower back at this time.   His urine culture is negative after all.  He will come in for a recheck UA and micro as a lab visit only at his convenience.

## 2013-02-06 LAB — COMPREHENSIVE METABOLIC PANEL
ALT: 26 U/L (ref 0–53)
AST: 28 U/L (ref 0–37)
Albumin: 4.5 g/dL (ref 3.5–5.2)
BUN: 17 mg/dL (ref 6–23)
Calcium: 8.8 mg/dL (ref 8.4–10.5)
Chloride: 101 mEq/L (ref 96–112)
Potassium: 3.8 mEq/L (ref 3.5–5.3)
Sodium: 140 mEq/L (ref 135–145)
Total Protein: 7.5 g/dL (ref 6.0–8.3)

## 2013-02-07 LAB — URINE CULTURE
Colony Count: NO GROWTH
Organism ID, Bacteria: NO GROWTH

## 2013-02-11 ENCOUNTER — Encounter: Payer: Self-pay | Admitting: Internal Medicine

## 2013-02-12 ENCOUNTER — Encounter: Payer: Self-pay | Admitting: Family Medicine

## 2013-02-12 NOTE — Addendum Note (Signed)
Addended by: Abbe Amsterdam C on: 02/12/2013 08:27 PM   Modules accepted: Orders

## 2013-07-05 ENCOUNTER — Ambulatory Visit (INDEPENDENT_AMBULATORY_CARE_PROVIDER_SITE_OTHER): Payer: PRIVATE HEALTH INSURANCE | Admitting: Family Medicine

## 2013-07-05 VITALS — BP 110/88 | HR 63 | Temp 98.2°F | Resp 16 | Ht 65.0 in | Wt 205.2 lb

## 2013-07-05 DIAGNOSIS — M79609 Pain in unspecified limb: Secondary | ICD-10-CM

## 2013-07-05 DIAGNOSIS — G5600 Carpal tunnel syndrome, unspecified upper limb: Secondary | ICD-10-CM

## 2013-07-05 DIAGNOSIS — Z758 Other problems related to medical facilities and other health care: Secondary | ICD-10-CM | POA: Insufficient documentation

## 2013-07-05 DIAGNOSIS — Z609 Problem related to social environment, unspecified: Secondary | ICD-10-CM

## 2013-07-05 DIAGNOSIS — Z789 Other specified health status: Secondary | ICD-10-CM | POA: Insufficient documentation

## 2013-07-05 MED ORDER — MELOXICAM 7.5 MG PO TABS: ORAL_TABLET | ORAL | Status: DC

## 2013-07-05 NOTE — Patient Instructions (Addendum)
Wear your splints as much as you can over the next 10- 14 days; be sure to wear them while you sleep!  Let me know if you are not better in the next week or two- Sooner if worse.   Take mobic 1 or 2 tablets a day as needed for pain

## 2013-07-05 NOTE — Progress Notes (Signed)
[facility] [address], [place] [id number] [phone]  Date:  07/05/2013   Name:  Joshua Garrett   DOB:  05/31/1965   MRN:  [id number]  PCP:  Tally DueGUEST, CHRIS WARREN, MD    Chief Complaint: Numbness and Hypertension   History of Present Illness:  Joshua Hatchetyuh Antonio is a 48 y.o. very pleasant male patient who presents with the following:  He is here today with complaint of numbness in his right hand.  He has noted this for about one month.  It comes and goes, but is worse when he holds something in the hand.  He notes it especailly with his coffee cup.  He notes the sx mostly in the index, long and ring finger.  The ring finger is the worst.   The left hand does not have any numbness, but does have some pain.  He has not noted any numbness or pain in his neck.    Pt does not speak much [place]- her daughter helped to interpret today.  He has not had any HTN in the past. Noted to have elevated BP here today No headaches.   No chest pain.    There are no active problems to display for this patient.   No past medical history on file.  No past surgical history on file.  History  Substance Use Topics  . Smoking status: Former Smoker    Quit date: 02/22/1987  . Smokeless tobacco: Never Used  . Alcohol Use: No    No family history on file.  No Known Allergies  Medication list has been reviewed and updated.  No current outpatient prescriptions on file prior to visit.   No current facility-administered medications on file prior to visit.    Review of Systems:  As per HPI- otherwise negative.   Physical Examination: Filed Vitals:   07/05/13 1207  BP: 192/92  Pulse: 63  Temp: 98.2 F (36.8 C)  Resp: 16   Filed Vitals:   07/05/13 1207  Height: 5\' 5"  (1.651 m)  Weight: 205 lb 3.2 oz (93.078 kg)   Body mass index is 34.15 kg/(m^2). Ideal Body Weight: Weight in (lb) to have BMI = 25: 149.9  GEN: WDWN, NAD, Non-toxic, A & O x 3, overweight, looks  well HEENT: Atraumatic, Normocephalic. Neck supple. No masses, No LAD. Ears and Nose: No external deformity. CV: RRR, No M/G/R. No JVD. No thrill. No extra heart sounds. PULM: CTA B, no wheezes, crackles, rhonchi. No retractions. No resp. distress. No accessory muscle use. ABD: S, NT, ND, +BS. No rebound. No HSM. EXTR: No c/c/e NEURO Normal gait.  PSYCH: Normally interactive. Conversant. Not depressed or anxious appearing.  Calm demeanor.  Normal strength and function of both hands.  I am not able to reproduce pain in his hands with provocative maneuvers    Assessment and Plan: Language barrier  Carpal tunnel syndrome   Suspect CTS.  Place in non- spica splints bilateral wrists.  Refilled his mobic to use as needed Repeated BP twice- normal both times.  Suspect his elevated BP might have been in error See patient instructions for more details.     Signed Abbe AmsterdamJessica Katrine Radich, MD

## 2014-02-07 ENCOUNTER — Ambulatory Visit (INDEPENDENT_AMBULATORY_CARE_PROVIDER_SITE_OTHER): Payer: BC Managed Care – PPO | Admitting: Family Medicine

## 2014-02-07 VITALS — BP 140/80 | HR 73 | Temp 97.5°F | Resp 16 | Ht 66.5 in | Wt 204.8 lb

## 2014-02-07 DIAGNOSIS — M79672 Pain in left foot: Secondary | ICD-10-CM

## 2014-02-07 DIAGNOSIS — M722 Plantar fascial fibromatosis: Secondary | ICD-10-CM

## 2014-02-07 MED ORDER — METHYLPREDNISOLONE ACETATE 80 MG/ML IJ SUSP
40.0000 mg | Freq: Once | INTRAMUSCULAR | Status: AC
  Administered 2014-02-07: 40 mg via INTRALESIONAL

## 2014-02-07 NOTE — Progress Notes (Signed)
° °  Subjective:    Patient ID: Joshua Garrett, male    DOB: 05/27/1965, 48 y.o.   MRN: [id number] This chart was scribed for Elvina SidleKurt Lauenstein, MD by Littie Deedsichard Sun, Medical Scribe. This patient was seen in Room 12 and the patient's care was started at 10:04 AM.    HPI HPI Comments: Joshua Garrett is a 48 y.o. male who presents to the [facility] complaining of gradual onset left heel pain that began about 5 months ago. Patient reports pain upon palpation and also some sleep disturbance due to the pain. He has also been walking differently due to the pain. He denies any falls or landing on his foot hard.  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  Review of Systems  Musculoskeletal: Positive for myalgias.  Psychiatric/Behavioral: Positive for sleep disturbance.       Objective:   Physical Exam CONSTITUTIONAL: Well developed/well nourished HEAD: Normocephalic/atraumatic EYES: EOM/PERRL ENMT: Mucous membranes moist NECK: supple no meningeal signs SPINE: entire spine nontender CV: S1/S2 noted, no murmurs/rubs/gallops noted LUNGS: Lungs are clear to auscultation bilaterally, no apparent distress ABDOMEN: soft, nontender, no rebound or guarding GU: no cva tenderness NEURO: Pt is awake/alert, moves all extremitiesx4 EXTREMITIES: pulses normal, full ROM SKIN: warm, color normal PSYCH: no abnormalities of mood noted  Patient requests shot rather than taking meds After prepping the tender medial aspect of the left heel with isopropyl alcohol, 40 mg depomedrol and 1/2 cc of 0.5% marcaine injected into proximal plantar fascia without complication.      Assessment & Plan:   This chart was scribed in my presence and reviewed by me personally.    ICD-9-CM ICD-10-CM   1. Plantar fasciitis of left foot 728.71 M72.2 methylPREDNISolone acetate (DEPO-MEDROL) injection 40 mg     Signed, Elvina SidleKurt Lauenstein, MD

## 2014-02-07 NOTE — Patient Instructions (Signed)
Plantar Fasciitis  Plantar fasciitis is a common condition that causes foot pain. It is soreness (inflammation) of the band of tough fibrous tissue on the bottom of the foot that runs from the heel bone (calcaneus) to the ball of the foot. The cause of this soreness may be from excessive standing, poor fitting shoes, running on hard surfaces, being overweight, having an abnormal walk, or overuse (this is common in runners) of the painful foot or feet. It is also common in aerobic exercise dancers and ballet dancers.  SYMPTOMS   Most people with plantar fasciitis complain of:   Severe pain in the morning on the bottom of their foot especially when taking the first steps out of bed. This pain recedes after a few minutes of walking.   Severe pain is experienced also during walking following a long period of inactivity.   Pain is worse when walking barefoot or up stairs  DIAGNOSIS    Your caregiver will diagnose this condition by examining and feeling your foot.   Special tests such as X-rays of your foot, are usually not needed.  PREVENTION    Consult a sports medicine professional before beginning a new exercise program.   Walking programs offer a good workout. With walking there is a lower chance of overuse injuries common to runners. There is less impact and less jarring of the joints.   Begin all new exercise programs slowly. If problems or pain develop, decrease the amount of time or distance until you are at a comfortable level.   Wear good shoes and replace them regularly.   Stretch your foot and the heel cords at the back of the ankle (Achilles tendon) both before and after exercise.   Run or exercise on even surfaces that are not hard. For example, asphalt is better than pavement.   Do not run barefoot on hard surfaces.   If using a treadmill, vary the incline.   Do not continue to workout if you have foot or joint problems. Seek professional help if they do not improve.  HOME CARE INSTRUCTIONS     Avoid activities that cause you pain until you recover.   Use ice or cold packs on the problem or painful areas after working out.   Only take over-the-counter or prescription medicines for pain, discomfort, or fever as directed by your caregiver.   Soft shoe inserts or athletic shoes with air or gel sole cushions may be helpful.   If problems continue or become more severe, consult a sports medicine caregiver or your own health care provider. Cortisone is a potent anti-inflammatory medication that may be injected into the painful area. You can discuss this treatment with your caregiver.  MAKE SURE YOU:    Understand these instructions.   Will watch your condition.   Will get help right away if you are not doing well or get worse.  Document Released: 11/02/2000 Document Revised: 05/02/2011 Document Reviewed: 01/02/2008  ExitCare Patient Information 2015 [employer]. This information is not intended to replace advice given to you by your health care provider. Make sure you discuss any questions you have with your health care provider.

## 2014-07-27 ENCOUNTER — Ambulatory Visit (INDEPENDENT_AMBULATORY_CARE_PROVIDER_SITE_OTHER): Payer: Self-pay | Admitting: Emergency Medicine

## 2014-07-27 VITALS — BP 122/80 | HR 69 | Temp 97.6°F | Resp 16 | Ht 66.0 in | Wt 198.2 lb

## 2014-07-27 DIAGNOSIS — L237 Allergic contact dermatitis due to plants, except food: Secondary | ICD-10-CM

## 2014-07-27 MED ORDER — PREDNISONE 20 MG PO TABS: 60.0000 mg | ORAL_TABLET | Freq: Every day | ORAL | Status: DC

## 2014-07-27 NOTE — Patient Instructions (Signed)

## 2014-07-27 NOTE — Progress Notes (Signed)
Subjective:  Patient ID: Joshua Garrett, male    DOB: 05/18/1965  Age: 49 y.o. MRN: [id number]  CC: Poison Ivy   HPI Joshua Garrett presents  patient did yard work and no has a rash on his face with moderate swelling of both eyes. He does have no visual symptoms. He has no difficulty swallowing or eating and sore throat. Has no cough. Has a rash on both forearms. Elbows. Has no improvement with over-the-counter medication.  No outpatient prescriptions prior to visit.   No facility-administered medications prior to visit.    History   Social History  . Marital Status: Married    Spouse Name: N/A  . Number of Children: 4  . Years of Education: N/A   Occupational History  . Manual Labor    Social History Main Topics  . Smoking status: Former Smoker    Quit date: 02/22/1987  . Smokeless tobacco: Never Used  . Alcohol Use: No  . Drug Use: No  . Sexual Activity: Yes   Other Topics Concern  . None   Social History Narrative   Originally from [place].  Came [place] 09[place]03[place]2003. Lives with his wife and 4 children.    History reviewed. No pertinent family history.  History reviewed. No pertinent past medical history.   Review of Systems  Constitutional: Negative for fever, chills and appetite change.  HENT: Negative for congestion, ear pain, postnasal drip, sinus pressure and sore throat.   Eyes: Negative for pain and redness.  Respiratory: Negative for cough, shortness of breath and wheezing.   Cardiovascular: Negative for leg swelling.  Gastrointestinal: Negative for nausea, vomiting, abdominal pain, diarrhea, constipation and blood in stool.  Endocrine: Negative for polyuria.  Genitourinary: Negative for dysuria, urgency, frequency and flank pain.  Musculoskeletal: Negative for gait problem.  Skin: Positive for rash.  Neurological: Negative for weakness and headaches.  Psychiatric/Behavioral: Negative for confusion and decreased concentration. The patient is not  nervous/anxious.     Objective:  BP 122/80 mmHg  Pulse 69  Temp(Src) 97.6 F (36.4 C) (Oral)  Resp 16  Ht 5\' 6"  (1.676 m)  Wt 198 lb 3.2 oz (89.903 kg)  BMI 32.01 kg/m2  SpO2 99%  BP Readings from Last 3 Encounters:  07/27/14 122/80  02/07/14 140/80  07/05/13 110/88    Wt Readings from Last 3 Encounters:  07/27/14 198 lb 3.2 oz (89.903 kg)  02/07/14 204 lb 12.8 oz (92.897 kg)  07/05/13 205 lb 3.2 oz (93.078 kg)    Physical Exam  Constitutional: He is oriented to person, place, and time. He appears well-developed and well-nourished.  HENT:  Head: Normocephalic and atraumatic.  Eyes: Conjunctivae are normal. Pupils are equal, round, and reactive to light.  Pulmonary/Chest: Effort normal.  Musculoskeletal: He exhibits no edema.  Neurological: He is alert and oriented to person, place, and time.  Skin: Skin is dry. Rash (Contact dermatitis on both arms and his face.) noted.  Psychiatric: He has a normal mood and affect. His behavior is normal. Thought content normal.    Lab Results  Component Value Date   WBC 6.9 02/05/2013   HGB 13.7* 02/05/2013   HCT 46.0 02/05/2013   GLUCOSE 53* 02/05/2013   ALT 26 02/05/2013   AST 28 02/05/2013   NA 140 02/05/2013   K 3.8 02/05/2013   CL 101 02/05/2013   CREATININE 1.02 02/05/2013   BUN 17 02/05/2013   CO2 28 02/05/2013      .  Assessment & Plan:  Joshua Garrett was seen today for poison ivy.  Diagnoses and all orders for this visit:  Poison ivy  Other orders -     predniSONE (DELTASONE) 20 MG tablet; Take 3 tablets (60 mg total) by mouth daily with breakfast.   I am having Mr. Joshua Garrett start on predniSONE.  Meds ordered this encounter  Medications  . predniSONE (DELTASONE) 20 MG tablet    Sig: Take 3 tablets (60 mg total) by mouth daily with breakfast.    Dispense:  18 tablet    Refill:  0    Appropriate red flag conditions were discussed with the patient as well as actions that should be taken.  Patient expressed  his understanding.  Follow-up: Return if symptoms worsen or fail to improve.  Carmelina Dane, MD

## 2015-02-06 ENCOUNTER — Encounter (HOSPITAL_COMMUNITY): Payer: Self-pay | Admitting: Emergency Medicine

## 2015-02-06 ENCOUNTER — Observation Stay (HOSPITAL_COMMUNITY): Payer: Self-pay | Admitting: Certified Registered Nurse Anesthetist

## 2015-02-06 ENCOUNTER — Encounter (HOSPITAL_COMMUNITY)
Admission: EM | Disposition: A | Discharge status: Home / Self Care | Payer: Self-pay | Source: Home / Self Care | Attending: Emergency Medicine

## 2015-02-06 ENCOUNTER — Observation Stay (HOSPITAL_COMMUNITY)
Admission: EM | Admit: 2015-02-06 | Discharge: 2015-02-07 | Disposition: A | Discharge status: Home / Self Care | Payer: Self-pay | Attending: Surgery | Admitting: Surgery

## 2015-02-06 ENCOUNTER — Emergency Department (HOSPITAL_COMMUNITY): Payer: Self-pay

## 2015-02-06 ENCOUNTER — Ambulatory Visit (INDEPENDENT_AMBULATORY_CARE_PROVIDER_SITE_OTHER): Payer: Self-pay | Admitting: Emergency Medicine

## 2015-02-06 VITALS — BP 120/88 | HR 94 | Temp 98.8°F | Resp 16 | Ht 67.0 in | Wt 204.0 lb

## 2015-02-06 DIAGNOSIS — K3580 Unspecified acute appendicitis: Secondary | ICD-10-CM | POA: Diagnosis present

## 2015-02-06 DIAGNOSIS — R1030 Lower abdominal pain, unspecified: Secondary | ICD-10-CM

## 2015-02-06 DIAGNOSIS — Z6831 Body mass index (BMI) 31.0-31.9, adult: Secondary | ICD-10-CM | POA: Insufficient documentation

## 2015-02-06 DIAGNOSIS — E669 Obesity, unspecified: Secondary | ICD-10-CM | POA: Insufficient documentation

## 2015-02-06 DIAGNOSIS — Z87891 Personal history of nicotine dependence: Secondary | ICD-10-CM | POA: Insufficient documentation

## 2015-02-06 DIAGNOSIS — K36 Other appendicitis: Secondary | ICD-10-CM

## 2015-02-06 DIAGNOSIS — K760 Fatty (change of) liver, not elsewhere classified: Secondary | ICD-10-CM | POA: Insufficient documentation

## 2015-02-06 DIAGNOSIS — K353 Acute appendicitis with localized peritonitis: Principal | ICD-10-CM | POA: Insufficient documentation

## 2015-02-06 HISTORY — PX: LAPAROSCOPIC APPENDECTOMY: SHX408

## 2015-02-06 LAB — POCT URINALYSIS DIP (MANUAL ENTRY)
BILIRUBIN UA: NEGATIVE
BILIRUBIN UA: NEGATIVE
Blood, UA: NEGATIVE
Glucose, UA: NEGATIVE
LEUKOCYTES UA: NEGATIVE
Nitrite, UA: NEGATIVE
Spec Grav, UA: 1.02
Urobilinogen, UA: 0.2
pH, UA: 6.5

## 2015-02-06 LAB — HEPATIC FUNCTION PANEL
ALBUMIN: 4.3 g/dL (ref 3.5–5.0)
ALK PHOS: 46 U/L (ref 38–126)
ALT: 51 U/L (ref 17–63)
AST: 38 U/L (ref 15–41)
Bilirubin, Direct: 0.2 mg/dL (ref 0.1–0.5)
Indirect Bilirubin: 1.2 mg/dL — ABNORMAL HIGH (ref 0.3–0.9)
TOTAL PROTEIN: 7.2 g/dL (ref 6.5–8.1)
Total Bilirubin: 1.4 mg/dL — ABNORMAL HIGH (ref 0.3–1.2)

## 2015-02-06 LAB — BASIC METABOLIC PANEL
ANION GAP: 8 (ref 5–15)
BUN: 14 mg/dL (ref 6–20)
CHLORIDE: 97 mmol/L — AB (ref 101–111)
CO2: 31 mmol/L (ref 22–32)
Calcium: 8.6 mg/dL — ABNORMAL LOW (ref 8.9–10.3)
Creatinine, Ser: 1 mg/dL (ref 0.61–1.24)
GFR calc Af Amer: >60 mL/min (ref >60)
GFR calc non Af Amer: >60 mL/min (ref >60)
GLUCOSE: 87 mg/dL (ref 65–99)
POTASSIUM: 3.4 mmol/L — AB (ref 3.5–5.1)
Sodium: 136 mmol/L (ref 135–145)

## 2015-02-06 LAB — POCT CBC
Granulocyte percent: 81.9 %G — AB (ref 37–80)
HCT, POC: 42.5 % — AB (ref 43.5–53.7)
HEMOGLOBIN: 14 g/dL — AB (ref 14.1–18.1)
LYMPH, POC: 2.1 (ref 0.6–3.4)
MCH, POC: 23.7 pg — AB (ref 27–31.2)
MCHC: 33 g/dL (ref 31.8–35.4)
MCV: 71.6 fL — AB (ref 80–97)
MID (cbc): 0.1 (ref 0–0.9)
MPV: 7.5 fL (ref 0–99.8)
POC Granulocyte: 10.2 — AB (ref 2–6.9)
POC LYMPH PERCENT: 17.3 %L (ref 10–50)
POC MID %: 0.8 % (ref 0–12)
Platelet Count, POC: 163 10*3/uL (ref 142–424)
RBC: 5.93 M/uL (ref 4.69–6.13)
RDW, POC: 13.9 %
WBC: 12.4 10*3/uL — AB (ref 4.6–10.2)

## 2015-02-06 LAB — POC MICROSCOPIC URINALYSIS (UMFC): Mucus: ABSENT

## 2015-02-06 SURGERY — APPENDECTOMY, LAPAROSCOPIC
Anesthesia: General | Site: Abdomen

## 2015-02-06 MED ORDER — ONDANSETRON HCL 4 MG/2ML IJ SOLN
INTRAMUSCULAR | Status: AC
  Filled 2015-02-06: qty 2

## 2015-02-06 MED ORDER — DIPHENHYDRAMINE HCL 50 MG/ML IJ SOLN: 25.0000 mg | Freq: Four times a day (QID) | INTRAMUSCULAR | Status: DC | PRN

## 2015-02-06 MED ORDER — FENTANYL CITRATE (PF) 250 MCG/5ML IJ SOLN
INTRAMUSCULAR | Status: AC
  Filled 2015-02-06: qty 5

## 2015-02-06 MED ORDER — SODIUM CHLORIDE 0.9 % IJ SOLN
INTRAMUSCULAR | Status: AC
  Filled 2015-02-06: qty 10

## 2015-02-06 MED ORDER — SODIUM CHLORIDE 0.9 % IV SOLN
INTRAVENOUS | Status: DC
  Administered 2015-02-06: 12:00:00 via INTRAVENOUS

## 2015-02-06 MED ORDER — OXYCODONE HCL 5 MG/5ML PO SOLN
5.0000 mg | Freq: Once | ORAL | Status: DC | PRN
  Filled 2015-02-06: qty 5

## 2015-02-06 MED ORDER — DIPHENHYDRAMINE HCL 25 MG PO CAPS: 25.0000 mg | ORAL_CAPSULE | Freq: Four times a day (QID) | ORAL | Status: DC | PRN

## 2015-02-06 MED ORDER — METRONIDAZOLE IN NACL 5-0.79 MG/ML-% IV SOLN
500.0000 mg | Freq: Four times a day (QID) | INTRAVENOUS | Status: DC
  Administered 2015-02-06: 500 mg via INTRAVENOUS
  Filled 2015-02-06: qty 100

## 2015-02-06 MED ORDER — MORPHINE SULFATE (PF) 4 MG/ML IV SOLN
4.0000 mg | Freq: Once | INTRAVENOUS | Status: AC
  Administered 2015-02-06: 4 mg via INTRAVENOUS
  Filled 2015-02-06: qty 1

## 2015-02-06 MED ORDER — BUPIVACAINE-EPINEPHRINE 0.25% -1:200000 IJ SOLN
INTRAMUSCULAR | Status: DC | PRN
  Administered 2015-02-06: 20 mL

## 2015-02-06 MED ORDER — ACETAMINOPHEN 325 MG PO TABS: 650.0000 mg | ORAL_TABLET | Freq: Four times a day (QID) | ORAL | Status: DC | PRN

## 2015-02-06 MED ORDER — LIDOCAINE HCL (CARDIAC) 20 MG/ML IV SOLN
INTRAVENOUS | Status: DC | PRN
  Administered 2015-02-06: 100 mg via INTRAVENOUS

## 2015-02-06 MED ORDER — HYDROCODONE-ACETAMINOPHEN 5-325 MG PO TABS
1.0000 | ORAL_TABLET | ORAL | Status: DC | PRN
  Administered 2015-02-07: 1 via ORAL
  Administered 2015-02-07: 2 via ORAL
  Filled 2015-02-06: qty 2
  Filled 2015-02-06: qty 1

## 2015-02-06 MED ORDER — 0.9 % SODIUM CHLORIDE (POUR BTL) OPTIME
TOPICAL | Status: DC | PRN
  Administered 2015-02-06: 1000 mL

## 2015-02-06 MED ORDER — MORPHINE SULFATE (PF) 2 MG/ML IV SOLN: 2.0000 mg | INTRAVENOUS | Status: DC | PRN

## 2015-02-06 MED ORDER — ENOXAPARIN SODIUM 40 MG/0.4ML ~~LOC~~ SOLN
40.0000 mg | SUBCUTANEOUS | Status: DC
  Filled 2015-02-06: qty 0.4

## 2015-02-06 MED ORDER — ACETAMINOPHEN 650 MG RE SUPP: 650.0000 mg | Freq: Four times a day (QID) | RECTAL | Status: DC | PRN

## 2015-02-06 MED ORDER — DEXAMETHASONE SODIUM PHOSPHATE 10 MG/ML IJ SOLN
INTRAMUSCULAR | Status: AC
  Filled 2015-02-06: qty 1

## 2015-02-06 MED ORDER — IBUPROFEN 200 MG PO TABS: 600.0000 mg | ORAL_TABLET | Freq: Four times a day (QID) | ORAL | Status: DC | PRN

## 2015-02-06 MED ORDER — HYDROMORPHONE HCL 1 MG/ML IJ SOLN
0.2500 mg | INTRAMUSCULAR | Status: DC | PRN
  Administered 2015-02-06 (×4): 0.5 mg via INTRAVENOUS

## 2015-02-06 MED ORDER — ROCURONIUM BROMIDE 100 MG/10ML IV SOLN
INTRAVENOUS | Status: AC
  Filled 2015-02-06: qty 1

## 2015-02-06 MED ORDER — KCL IN DEXTROSE-NACL 20-5-0.45 MEQ/L-%-% IV SOLN
INTRAVENOUS | Status: DC
  Administered 2015-02-07: 10:00:00 via INTRAVENOUS
  Administered 2015-02-07: 75 mL/h via INTRAVENOUS
  Filled 2015-02-06 (×3): qty 1000

## 2015-02-06 MED ORDER — ALBUTEROL SULFATE HFA 108 (90 BASE) MCG/ACT IN AERS
INHALATION_SPRAY | RESPIRATORY_TRACT | Status: AC
  Filled 2015-02-06: qty 6.7

## 2015-02-06 MED ORDER — METRONIDAZOLE IN NACL 5-0.79 MG/ML-% IV SOLN
500.0000 mg | Freq: Three times a day (TID) | INTRAVENOUS | Status: DC
  Administered 2015-02-07 (×2): 500 mg via INTRAVENOUS
  Filled 2015-02-06 (×4): qty 100

## 2015-02-06 MED ORDER — ALBUTEROL SULFATE HFA 108 (90 BASE) MCG/ACT IN AERS
INHALATION_SPRAY | RESPIRATORY_TRACT | Status: DC | PRN
  Administered 2015-02-06: 4 via RESPIRATORY_TRACT

## 2015-02-06 MED ORDER — ONDANSETRON HCL 4 MG/2ML IJ SOLN: 4.0000 mg | Freq: Four times a day (QID) | INTRAMUSCULAR | Status: DC | PRN

## 2015-02-06 MED ORDER — DEXTROSE 5 % IV SOLN
2.0000 g | INTRAVENOUS | Status: DC
  Administered 2015-02-07: 2 g via INTRAVENOUS
  Filled 2015-02-06: qty 2

## 2015-02-06 MED ORDER — BUPIVACAINE-EPINEPHRINE (PF) 0.25% -1:200000 IJ SOLN
INTRAMUSCULAR | Status: AC
  Filled 2015-02-06: qty 30

## 2015-02-06 MED ORDER — DEXTROSE 5 % IV SOLN
2.0000 g | INTRAVENOUS | Status: DC
  Administered 2015-02-06: 2 g via INTRAVENOUS
  Filled 2015-02-06: qty 2

## 2015-02-06 MED ORDER — ONDANSETRON HCL 4 MG/2ML IJ SOLN
4.0000 mg | Freq: Once | INTRAMUSCULAR | Status: AC
  Administered 2015-02-06: 4 mg via INTRAVENOUS
  Filled 2015-02-06: qty 2

## 2015-02-06 MED ORDER — HYDROMORPHONE HCL 1 MG/ML IJ SOLN
1.0000 mg | INTRAMUSCULAR | Status: DC | PRN
  Administered 2015-02-07: 1 mg via INTRAVENOUS
  Filled 2015-02-06 (×2): qty 1

## 2015-02-06 MED ORDER — HYDROCODONE-ACETAMINOPHEN 5-325 MG PO TABS: 1.0000 | ORAL_TABLET | ORAL | Status: DC | PRN

## 2015-02-06 MED ORDER — PHENOL 1.4 % MT LIQD
1.0000 | OROMUCOSAL | Status: DC | PRN
  Administered 2015-02-06: 1 via OROMUCOSAL
  Filled 2015-02-06: qty 177

## 2015-02-06 MED ORDER — HYDROMORPHONE HCL 1 MG/ML IJ SOLN
INTRAMUSCULAR | Status: AC
  Filled 2015-02-06: qty 1

## 2015-02-06 MED ORDER — MIDAZOLAM HCL 2 MG/2ML IJ SOLN
INTRAMUSCULAR | Status: AC
Start: 2015-02-06 — End: 2015-02-06
  Filled 2015-02-06: qty 2

## 2015-02-06 MED ORDER — POTASSIUM CHLORIDE IN NACL 20-0.9 MEQ/L-% IV SOLN
INTRAVENOUS | Status: DC
  Filled 2015-02-06 (×2): qty 1000

## 2015-02-06 MED ORDER — ROCURONIUM BROMIDE 100 MG/10ML IV SOLN
INTRAVENOUS | Status: DC | PRN
  Administered 2015-02-06: 10 mg via INTRAVENOUS
  Administered 2015-02-06: 35 mg via INTRAVENOUS

## 2015-02-06 MED ORDER — ONDANSETRON 4 MG PO TBDP
4.0000 mg | ORAL_TABLET | Freq: Four times a day (QID) | ORAL | Status: DC | PRN
  Filled 2015-02-06: qty 1

## 2015-02-06 MED ORDER — LACTATED RINGERS IR SOLN
Status: DC | PRN
  Administered 2015-02-06: 1

## 2015-02-06 MED ORDER — OXYCODONE HCL 5 MG PO TABS: 5.0000 mg | ORAL_TABLET | Freq: Once | ORAL | Status: DC | PRN

## 2015-02-06 MED ORDER — LIDOCAINE HCL (CARDIAC) 20 MG/ML IV SOLN
INTRAVENOUS | Status: AC
  Filled 2015-02-06: qty 5

## 2015-02-06 MED ORDER — EPHEDRINE SULFATE 50 MG/ML IJ SOLN
INTRAMUSCULAR | Status: AC
  Filled 2015-02-06: qty 1

## 2015-02-06 MED ORDER — SUGAMMADEX SODIUM 200 MG/2ML IV SOLN
INTRAVENOUS | Status: DC | PRN
  Administered 2015-02-06: 200 mg via INTRAVENOUS

## 2015-02-06 MED ORDER — PROPOFOL 10 MG/ML IV BOLUS
INTRAVENOUS | Status: DC | PRN
  Administered 2015-02-06: 130 mg via INTRAVENOUS

## 2015-02-06 MED ORDER — PROPOFOL 10 MG/ML IV BOLUS
INTRAVENOUS | Status: AC
  Filled 2015-02-06: qty 20

## 2015-02-06 MED ORDER — ACETAMINOPHEN 650 MG RE SUPP
650.0000 mg | Freq: Four times a day (QID) | RECTAL | Status: DC | PRN
  Filled 2015-02-06: qty 1

## 2015-02-06 MED ORDER — ONDANSETRON HCL 4 MG/2ML IJ SOLN
4.0000 mg | Freq: Four times a day (QID) | INTRAMUSCULAR | Status: DC | PRN
  Administered 2015-02-06: 4 mg via INTRAVENOUS

## 2015-02-06 MED ORDER — DEXAMETHASONE SODIUM PHOSPHATE 10 MG/ML IJ SOLN
INTRAMUSCULAR | Status: DC | PRN
  Administered 2015-02-06: 10 mg via INTRAVENOUS

## 2015-02-06 MED ORDER — SUGAMMADEX SODIUM 200 MG/2ML IV SOLN
INTRAVENOUS | Status: AC
  Filled 2015-02-06: qty 2

## 2015-02-06 MED ORDER — IOHEXOL 300 MG/ML  SOLN
100.0000 mL | Freq: Once | INTRAMUSCULAR | Status: AC | PRN
  Administered 2015-02-06: 100 mL via INTRAVENOUS

## 2015-02-06 MED ORDER — LACTATED RINGERS IV SOLN
INTRAVENOUS | Status: DC | PRN
  Administered 2015-02-06: 16:00:00 via INTRAVENOUS

## 2015-02-06 MED ORDER — MIDAZOLAM HCL 5 MG/5ML IJ SOLN
INTRAMUSCULAR | Status: DC | PRN
  Administered 2015-02-06 (×2): 1 mg via INTRAVENOUS

## 2015-02-06 MED ORDER — SUCCINYLCHOLINE CHLORIDE 20 MG/ML IJ SOLN
INTRAMUSCULAR | Status: DC | PRN
  Administered 2015-02-06: 100 mg via INTRAVENOUS

## 2015-02-06 MED ORDER — FENTANYL CITRATE (PF) 100 MCG/2ML IJ SOLN
INTRAMUSCULAR | Status: DC | PRN
  Administered 2015-02-06 (×3): 50 ug via INTRAVENOUS

## 2015-02-06 SURGICAL SUPPLY — 37 items
APL SKNCLS STERI-STRIP NONHPOA (GAUZE/BANDAGES/DRESSINGS) ×1
APPLIER CLIP ROT 10 11.4 M/L (STAPLE)
APR CLP MED LRG 11.4X10 (STAPLE)
BAG SPEC RTRVL LRG 6X4 10 (ENDOMECHANICALS) ×1
BENZOIN TINCTURE PRP APPL 2/3 (GAUZE/BANDAGES/DRESSINGS) ×2 IMPLANT
CLIP APPLIE ROT 10 11.4 M/L (STAPLE) IMPLANT
CLOSURE STERI-STRIP 1/4X4 (GAUZE/BANDAGES/DRESSINGS) ×1 IMPLANT
COVER SURGICAL LIGHT HANDLE (MISCELLANEOUS) ×2 IMPLANT
CUTTER FLEX LINEAR 45M (STAPLE) ×1 IMPLANT
DECANTER SPIKE VIAL GLASS SM (MISCELLANEOUS) ×2 IMPLANT
DRAPE LAPAROSCOPIC ABDOMINAL (DRAPES) ×2 IMPLANT
ELECT REM PT RETURN 9FT ADLT (ELECTROSURGICAL) ×2
ELECTRODE REM PT RTRN 9FT ADLT (ELECTROSURGICAL) ×1 IMPLANT
ENDOLOOP SUT PDS II  0 18 (SUTURE)
ENDOLOOP SUT PDS II 0 18 (SUTURE) IMPLANT
GAUZE SPONGE 4X4 12PLY STRL (GAUZE/BANDAGES/DRESSINGS) ×1 IMPLANT
GLOVE SURG ORTHO 8.0 STRL STRW (GLOVE) ×2 IMPLANT
GOWN STRL REUS W/TWL XL LVL3 (GOWN DISPOSABLE) ×4 IMPLANT
KIT BASIN OR (CUSTOM PROCEDURE TRAY) ×2 IMPLANT
POUCH SPECIMEN RETRIEVAL 10MM (ENDOMECHANICALS) ×2 IMPLANT
RELOAD 45 VASCULAR/THIN (ENDOMECHANICALS) IMPLANT
RELOAD STAPLE 45 2.5 WHT GRN (ENDOMECHANICALS) IMPLANT
RELOAD STAPLE 45 3.5 BLU ETS (ENDOMECHANICALS) IMPLANT
RELOAD STAPLE TA45 3.5 REG BLU (ENDOMECHANICALS) ×2 IMPLANT
SET IRRIG TUBING LAPAROSCOPIC (IRRIGATION / IRRIGATOR) ×2 IMPLANT
SHEARS HARMONIC ACE PLUS 36CM (ENDOMECHANICALS) ×2 IMPLANT
STRIP CLOSURE SKIN 1/2X4 (GAUZE/BANDAGES/DRESSINGS) ×2 IMPLANT
SUT MNCRL AB 4-0 PS2 18 (SUTURE) ×2 IMPLANT
TAPE CLOTH SURG 4X10 WHT LF (GAUZE/BANDAGES/DRESSINGS) ×1 IMPLANT
TOWEL OR 17X26 10 PK STRL BLUE (TOWEL DISPOSABLE) ×2 IMPLANT
TOWEL OR NON WOVEN STRL DISP B (DISPOSABLE) ×2 IMPLANT
TRAY FOLEY W/METER SILVER 14FR (SET/KITS/TRAYS/PACK) ×2 IMPLANT
TRAY FOLEY W/METER SILVER 16FR (SET/KITS/TRAYS/PACK) ×2 IMPLANT
TRAY LAPAROSCOPIC (CUSTOM PROCEDURE TRAY) ×2 IMPLANT
TROCAR BLADELESS OPT 5 75 (ENDOMECHANICALS) ×2 IMPLANT
TROCAR XCEL BLUNT TIP 100MML (ENDOMECHANICALS) ×2 IMPLANT
TROCAR XCEL NON-BLD 11X100MML (ENDOMECHANICALS) ×2 IMPLANT

## 2015-02-06 NOTE — ED Notes (Addendum)
As best understood, pt was seen at [facility]gent [facility] this morning and told to come here immediately for what family reports as "possible appendicitis." Having RLQ and right and left lower groin pain. Describes pain as "burning." Unclear if any other symptoms associated. Pt speaks limited English-reports speaking a dialect of [place]. WBC this morning was 12.4. Urine clear of bacteria. See progress note by Olean Reeeborah Gessner, FNP- reports rebound tenderness and guarding, +chills, suspected acute appendicitis.

## 2015-02-06 NOTE — Transfer of Care (Signed)
Immediate Anesthesia Transfer of Care Note  Patient: Joshua Garrett  Procedure(s) Performed: Procedure(s): APPENDECTOMY LAPAROSCOPIC (N/A)  Patient Location: PACU  Anesthesia Type:General  Level of Consciousness:  sedated, patient cooperative and responds to stimulation  Airway & Oxygen Therapy:Patient Spontanous Breathing and Patient connected to face mask oxgen  Post-op Assessment:  Report given to PACU RN and Post -op Vital signs reviewed and stable  Post vital signs:  Reviewed and stable  Last Vitals:  Filed Vitals:   02/06/15 1232 02/06/15 1343  BP: 129/90 142/86  Pulse: 76 78  Temp: 36.8 C 36.5 C  Resp: 16 16    Complications: No apparent anesthesia complications

## 2015-02-06 NOTE — ED Notes (Signed)
Patient transported to CT 

## 2015-02-06 NOTE — ED Notes (Signed)
Surgery at bedside.

## 2015-02-06 NOTE — Patient Instructions (Signed)
Please go straight to the [facility] emergency department located at  [address]

## 2015-02-06 NOTE — Progress Notes (Signed)
Subjective:    Patient ID: Joshua Garrett, male    DOB: 10/28/1965, 49 y.o.   MRN: [id number]  HPI This is a pleasant 49 yo male who was awoken from sleep at 2 am this morning. He is accompanied by his coworker. They are both [place] and speak little english. He is Counselling psychologistMontagnard. Pain radiates to his back. Normal BM this morning. No vomiting, no diarrhea, no dysuria or difficulty urinating. Feels nausea. Pain with walking.   History reviewed. No pertinent past medical history. History reviewed. No pertinent past surgical history. History reviewed. No pertinent family history. Social History  Substance Use Topics  . Smoking status: Former Smoker    Quit date: 02/22/1987  . Smokeless tobacco: Never Used  . Alcohol Use: No    Review of Systems  Constitutional: Positive for chills. Negative for fever.  Respiratory: Negative for cough and shortness of breath.   Cardiovascular: Negative for chest pain.  Gastrointestinal: Positive for nausea, abdominal pain and abdominal distention. Negative for vomiting, diarrhea and constipation.  Genitourinary: Negative for dysuria and difficulty urinating.      Objective:   Physical Exam  Constitutional: He is oriented to person, place, and time. He appears well-developed and well-nourished. No distress.  HENT:  Head: Normocephalic and atraumatic.  Eyes: Conjunctivae are normal.  Neck: Normal range of motion. Neck supple.  Cardiovascular: Normal rate, regular rhythm and normal heart sounds.   Pulmonary/Chest: Effort normal and breath sounds normal.  Abdominal: Soft. Bowel sounds are normal. He exhibits distension. There is tenderness. There is rebound and guarding.  Musculoskeletal: Normal range of motion.  Neurological: He is alert and oriented to person, place, and time.  Skin: Skin is warm and dry. He is not diaphoretic.  Psychiatric: He has a normal mood and affect. His behavior is normal. Judgment and thought content normal.   BP 120/88 mmHg   Pulse 94  Temp(Src) 98.8 F (37.1 C) (Oral)  Resp 16  Ht 5\' 7"  (1.702 m)  Wt 204 lb (92.534 kg)  BMI 31.94 kg/m2  SpO2 99% Results for orders placed or performed in visit on 02/06/15  POCT CBC  Result Value Ref Range   WBC 12.4 (A) 4.6 - 10.2 K/uL   Lymph, poc 2.1 0.6 - 3.4   POC LYMPH PERCENT 17.3 10 - 50 %L   MID (cbc) 0.1 0 - 0.9   POC MID % 0.8 0 - 12 %M   POC Granulocyte 10.2 (A) 2 - 6.9   Granulocyte percent 81.9 (A) 37 - 80 %G   RBC 5.93 4.69 - 6.13 M/uL   Hemoglobin 14.0 (A) 14.1 - 18.1 g/dL   HCT, POC 40.942.5 (A) 81.143.5 - 53.7 %   MCV 71.6 (A) 80 - 97 fL   MCH, POC 23.7 (A) 27 - 31.2 pg   MCHC 33.0 31.8 - 35.4 g/dL   RDW, POC 91.413.9 %   Platelet Count, POC 163 142 - 424 K/uL   MPV 7.5 0 - 99.8 fL  POCT Microscopic Urinalysis ([facility])  Result Value Ref Range   WBC,UR,HPF,POC None None WBC/hpf   RBC,UR,HPF,POC None None RBC/hpf   Bacteria None None, Too numerous to count   Mucus Absent Absent   Epithelial Cells, UR Per Microscopy Few (A) None, Too numerous to count cells/hpf  POCT urinalysis dipstick  Result Value Ref Range   Color, UA yellow yellow   Clarity, UA clear clear   Glucose, UA negative negative   Bilirubin, UA negative negative  Ketones, POC UA negative negative   Spec Grav, UA 1.020    Blood, UA negative negative   pH, UA 6.5    Protein Ur, POC trace (A) negative   Urobilinogen, UA 0.2    Nitrite, UA Negative Negative   Leukocytes, UA Negative Negative       Assessment & Plan:  Discussed with Dr. Cleta Alberts who also examined patient. Called patient's daughter and had conversation on speaker phone regarding importance of going to hospital. 1. Lower abdominal pain - POCT CBC - POCT Microscopic Urinalysis ([facility]) - POCT urinalysis dipstick - suspect acute appendix- triage nurse at [facility] notified that patient will be arriving shortly - stressed importance of patient going immediately to ED, patient agreed.   Olean Ree, FNP-BC  [facility], [facility]  02/06/2015 10:32 AM

## 2015-02-06 NOTE — Op Note (Signed)
OPERATIVE REPORT - LAPAROSCOPIC APPENDECTOMY  Preop diagnosis: Acute appendicitis  Postop diagnosis: Same  Procedure: Laparoscopic appendectomy  Surgeon:  Velora Hecklerodd M. Chalonda Schlatter, MD, FACS  Anesthesia: General endotracheal  Estimated blood loss: Minimal  Preparation: Chlora-prep  Complications: None  Indications:  Patient is a 49 yo [place] male with onset of abd pain.  Elevated WBC.  CT scan consistent with acute appendicitis.  Procedure:  Patient is brought to the operating room and placed in a supine position on the operating room table. Following administration of general anesthesia, a time out was held and the patient's name and procedure is confirmed. Patient is then prepped and draped in the usual strict aseptic fashion.  After ascertaining that an adequate level of anesthesia has been achieved, a peri-umbilical incision is made with a #15 blade. Dissection is carried down to the fascia. Fascia is incised in the midline and the peritoneal cavity is entered cautiously. A #0-vicryl pursestring suture is placed in the fascia. An Hassan cannula is introduced under direct vision and secured with the pursestring suture. The abdomen is insufflated with carbon dioxide. The laparoscope is introduced and the abdomen is explored. Operative ports are placed in the right upper quadrant and left lower quadrant. The appendix is identified. The mesoappendix is divided with the harmonic scalpel. Dissection is carried down to the base of the appendix. The base of the appendix is dissected out clearing the junction with the cecal wall. Using an Endo-GIA stapler, the base of the appendix is transected at the junction with the cecal wall. There is good approximation of tissue along the staple line. There is good hemostasis along the staple line. The appendix is placed into an endo-catch bag and withdrawn through the umbilical port. The #0-vicryl pursestring suture is tied securely.  Right lower quadrant is  irrigated with warm saline which is evacuated. Good hemostasis is noted. Ports are removed under direct vision. Good hemostasis is noted at the port sites. Pneumoperitoneum is released.  Skin incisions are anesthetized with local anesthetic. Wounds are closed with interrupted 4-0 Monocryl subcuticular sutures. Wounds are washed and dried and benzoin and Steri-Strips are applied. Dressings are applied. The patient is awakened from anesthesia and brought to the recovery room. The patient tolerated the procedure well.  Velora Hecklerodd M. Tryce Surratt, MD, [facility] Office: [phone]

## 2015-02-06 NOTE — Anesthesia Preprocedure Evaluation (Signed)
Anesthesia Evaluation  Patient identified by MRN, date of birth, ID band Patient awake    Reviewed: Allergy & Precautions, NPO status , Patient's Chart, lab work & pertinent test results  Airway Mallampati: II   Neck ROM: full    Dental   Pulmonary former smoker,    breath sounds clear to auscultation       Cardiovascular negative cardio ROS   Rhythm:regular Rate:Normal     Neuro/Psych    GI/Hepatic   Endo/Other  obese  Renal/GU      Musculoskeletal   Abdominal   Peds  Hematology   Anesthesia Other Findings   Reproductive/Obstetrics                             Anesthesia Physical Anesthesia Plan  ASA: II  Anesthesia Plan: General   Post-op Pain Management:    Induction: Intravenous  Airway Management Planned: Oral ETT  Additional Equipment:   Intra-op Plan:   Post-operative Plan: Extubation in OR  Informed Consent: I have reviewed the patients History and Physical, chart, labs and discussed the procedure including the risks, benefits and alternatives for the proposed anesthesia with the patient or authorized representative who has indicated his/her understanding and acceptance.     Plan Discussed with: CRNA, Anesthesiologist and Surgeon  Anesthesia Plan Comments:         Anesthesia Quick Evaluation

## 2015-02-06 NOTE — ED Provider Notes (Signed)
CSN: [id number]     Arrival date & time 02/06/15  1112 History   First MD Initiated Contact with Patient 02/06/15 1132     Chief Complaint  Patient presents with  . Abdominal Pain  . Possible Appendicitis      (Consider location/radiation/quality/duration/timing/severity/associated sxs/prior Treatment) HPI Comments: Went o urgent care pta and had a cbc which showed elevated wbc, pt sent here for r/o appy  Patient is a [age] y.o. male presenting with abdominal pain. The history is provided by the patient.  Abdominal Pain Pain location:  RLQ Pain quality: dull   Pain radiates to:  Does not radiate Pain severity:  Moderate Onset quality:  Sudden Duration:  1 day Timing:  Constant Progression:  Worsening Chronicity:  New Context: not previous surgeries   Relieved by:  Nothing Worsened by:  Movement Ineffective treatments:  None tried Associated symptoms: no cough, no diarrhea, no fever and no vomiting     History reviewed. No pertinent past medical history. History reviewed. No pertinent past surgical history. History reviewed. No pertinent family history. Social History  Substance Use Topics  . Smoking status: Former Smoker    Quit date: 02/22/1987  . Smokeless tobacco: Never Used  . Alcohol Use: No    Review of Systems  Constitutional: Negative for fever.  Respiratory: Negative for cough.   Gastrointestinal: Positive for abdominal pain. Negative for vomiting and diarrhea.  All other systems reviewed and are negative.     Allergies  Review of patient's allergies indicates no known allergies.  Home Medications   Prior to Admission medications   Not on File   BP 129/91 mmHg  Pulse 86  Temp(Src) 97.4 F (36.3 C) (Oral)  Resp 16  SpO2 100% Physical Exam  Constitutional: He is oriented to person, place, and time. He appears well-developed and well-nourished.  Non-toxic appearance. No distress.  HENT:  Head: Normocephalic and atraumatic.  Eyes: Conjunctivae,  EOM and lids are normal. Pupils are equal, round, and reactive to light.  Neck: Normal range of motion. Neck supple. No tracheal deviation present. No thyroid mass present.  Cardiovascular: Normal rate, regular rhythm and normal heart sounds.  Exam reveals no gallop.   No murmur heard. Pulmonary/Chest: Effort normal and breath sounds normal. No stridor. No respiratory distress. He has no decreased breath sounds. He has no wheezes. He has no rhonchi. He has no rales.  Abdominal: Soft. Normal appearance and bowel sounds are normal. He exhibits no distension. There is tenderness in the right lower quadrant. There is guarding. There is no rigidity, no rebound and no CVA tenderness.    Musculoskeletal: Normal range of motion. He exhibits no edema or tenderness.  Neurological: He is alert and oriented to person, place, and time. He has normal strength. No cranial nerve deficit or sensory deficit. GCS eye subscore is 4. GCS verbal subscore is 5. GCS motor subscore is 6.  Skin: Skin is warm and dry. No abrasion and no rash noted.  Psychiatric: He has a normal mood and affect. His speech is normal and behavior is normal.  Nursing note and vitals reviewed.   ED Course  Procedures (including critical care time) Labs Review Labs Reviewed  BASIC METABOLIC PANEL    Imaging Review No results found. I have personally reviewed and evaluated these images and lab results as part of my medical decision-making.   EKG Interpretation None      MDM   Final diagnoses:  None    Findings c/w early appy,  gen surgery consulted    Lorre Nick, MD 02/06/15 [phone]

## 2015-02-06 NOTE — H&P (Signed)
Chief Complaint: abdominal pain HPI: Joshua Garrett is a healthy 49 year old male who presents to [facility] from an urgent care with abdominal pain and leukocytosis, suspicion for appendicitis.  The patient reports developing sudden onset periumbilical abdominal pain around 2AM this morning.  Radiating to the RLQ.  Denies fever, chills, nausea, vomiting or diarrhea.  Last oral intake was last night.  No modifying factors.  No aggravating or alleviating factors.  Characterized as sharp and "full pain."  Denies previous symptoms.  Denies previous surgeries.  He does not take any medication and has no past medical problems.  CT of A/P shows appendiceal enlargement, appendicolith suspicious for appendicitis.  WBC is 12.4k.  CT also showed a dilated CBD.  There are no LFTs.  Hepatic steatosis.  Both the patient and daughter declined a telephone interpreter which was offered several times during my interview.  History reviewed. No pertinent past medical history.  History reviewed. No pertinent past surgical history.  History reviewed. No pertinent family history. Social History:  reports that he quit smoking about 27 years ago. He has never used smokeless tobacco. He reports that he does not drink alcohol or use illicit drugs.  Allergies: No Known Allergies   (Not in a hospital admission)  Results for orders placed or performed during the hospital encounter of 02/06/15 (from the past 48 hour(s))  Basic metabolic panel     Status: Abnormal   Collection Time: 02/06/15 12:26 PM  Result Value Ref Range   Sodium 136 135 - 145 mmol/L   Potassium 3.4 (L) 3.5 - 5.1 mmol/L   Chloride 97 (L) 101 - 111 mmol/L   CO2 31 22 - 32 mmol/L   Glucose, Bld 87 65 - 99 mg/dL   BUN 14 6 - 20 mg/dL   Creatinine, Ser 1.00 0.61 - 1.24 mg/dL   Calcium 8.6 (L) 8.9 - 10.3 mg/dL   GFR calc non Af Amer >60 >60 mL/min   GFR calc Af Amer >60 >60 mL/min    Comment: (NOTE) The eGFR has been calculated using the CKD EPI  equation. This calculation has not been validated in all clinical situations. eGFR's persistently <60 mL/min signify possible Chronic Kidney Disease.    Anion gap 8 5 - 15   Ct Abdomen Pelvis W Contrast  02/06/2015  CLINICAL DATA:  Abdominal pain. Pain radiating to back. Possible appendicitis. EXAM: CT ABDOMEN AND PELVIS WITH CONTRAST TECHNIQUE: Multidetector CT imaging of the abdomen and pelvis was performed using the standard protocol following bolus administration of intravenous contrast. CONTRAST:  179m OMNIPAQUE IOHEXOL 300 MG/ML  SOLN COMPARISON:  None. FINDINGS: Lower chest: Mild motion degradation throughout. Clear lung bases. Normal heart size without pericardial or pleural effusion. Fluid level in the thoracic esophagus on image 12/series 2. Hepatobiliary: Mild hepatic steatosis. Foci of hyper enhancement within the liver. The most well-defined measures 9 mm in the left hepatic lobe on image 25/series 2. Normal gallbladder. Common duct is mildly dilated, 11 mm on coronal image 49. No obstructive mass or stone identified. There is a periampullary duodenal diverticulum. Pancreas: Normal, without mass or ductal dilatation. Spleen: Normal in size, without focal abnormality. Adrenals/Urinary Tract: Normal adrenal glands. Normal kidneys, without hydronephrosis. Normal urinary bladder. Stomach/Bowel: Normal stomach, without wall thickening. Normal colon and terminal ileum. The appendiceal tip measures 9 mm on image 61/ series 2. A tiny appendicolith identified within. Suggestion of mucosal hyper enhancement on image 60/series 2. Normal small bowel. Vascular/Lymphatic: Normal caliber of the aorta and branch  vessels. No abdominopelvic adenopathy. Reproductive: Normal prostate. Other: No significant free fluid. Musculoskeletal: Scattered bone islands. IMPRESSION: 1. Mild appendiceal enlargement with tiny appendicolith and suggestion of mucosal hyper enhancement. Findings are suspicious for early or mild  appendicitis, especially involving the tip. No acute complication. 2. Otherwise, mildly motion degraded exam. 3. Common duct dilatation, without cause identified. Correlate with bilirubin level. If this is elevated, consider nonemergent outpatient MRCP or ERCP. 4. Esophageal air fluid level suggests dysmotility or gastroesophageal reflux. 5. Hepatic steatosis with Hypervascular foci within the liver. Presuming no history of primary malignancy, likely perfusion anomalies or small hemangiomas. These results were called by telephone at the time of interpretation on 02/06/2015 at 1:54 pm to Dr. Lacretia Leigh , who verbally acknowledged these results. Electronically Signed   By: Abigail Miyamoto M.D.   On: 02/06/2015 13:57    Review of Systems  Constitutional: Negative for fever, chills, weight loss, malaise/fatigue and diaphoresis.  Eyes: Negative for blurred vision, double vision, photophobia, pain, discharge and redness.  Respiratory: Negative for cough, hemoptysis, sputum production, shortness of breath and wheezing.   Cardiovascular: Negative for chest pain, palpitations, orthopnea, claudication, leg swelling and PND.  Gastrointestinal: Positive for abdominal pain. Negative for heartburn, nausea, vomiting, diarrhea, constipation, blood in stool and melena.  Genitourinary: Negative for dysuria, urgency, frequency, hematuria and flank pain.  Musculoskeletal: Negative for myalgias, back pain, joint pain and neck pain.  Neurological: Negative for dizziness, tingling, tremors, sensory change, speech change, focal weakness, seizures, loss of consciousness, weakness and headaches.  Psychiatric/Behavioral: Negative for substance abuse.    Blood pressure 142/86, pulse 78, temperature 97.7 F (36.5 C), temperature source Oral, resp. rate 16, SpO2 100 %. Physical Exam  Constitutional: He appears well-developed and well-nourished. No distress.  Eyes: Conjunctivae are normal. Pupils are equal, round, and reactive  to light.  Neck: Normal range of motion. Neck supple.  Cardiovascular: Normal rate and normal heart sounds.  Exam reveals no gallop.   No murmur heard. Respiratory: Effort normal and breath sounds normal.  GI: Soft. Bowel sounds are normal. He exhibits no distension and no mass. There is no rebound and no guarding.  TTP RLQ   Musculoskeletal: Normal range of motion. He exhibits no edema or tenderness.  Neurological: He is alert.  Skin: Skin is warm and dry. No rash noted. He is not diaphoretic. No erythema. No pallor.  Psychiatric: He has a normal mood and affect. His behavior is normal. Judgment and thought content normal.     Assessment/Plan Acute appendicitis-surgical risks discussed including but not limited to infection, bleeding, injury to surrounding structures, open surgery, anesthesia risks.  The patient verbalizes understanding and wishes to proceed.   Dilated CBD and hepatic steatosis on CT-check LFTs ID-rocephin/flagyl VTE prophylaxis-SCD/lovenox FEN-NPO, IVF with K Dispo-to OR  Andros Channing ANP-BC 02/06/2015, 2:33 PM

## 2015-02-06 NOTE — Anesthesia Procedure Notes (Signed)
Procedure Name: Intubation Date/Time: 02/06/2015 4:05 PM Performed by: Orest DikesPETERS, Shirlie Enck J Pre-anesthesia Checklist: Patient identified, Emergency Drugs available, Suction available and Patient being monitored Patient Re-evaluated:Patient Re-evaluated prior to inductionOxygen Delivery Method: Circle System Utilized Preoxygenation: Pre-oxygenation with 100% oxygen Intubation Type: IV induction, Rapid sequence and Cricoid Pressure applied Laryngoscope Size: Mac and 4 Grade View: Grade I Tube type: Oral Tube size: 7.5 mm Number of attempts: 1 Airway Equipment and Method: Stylet and Oral airway Placement Confirmation: ETT inserted through vocal cords under direct vision,  positive ETCO2 and breath sounds checked- equal and bilateral Secured at: 21 cm Tube secured with: Tape Dental Injury: Teeth and Oropharynx as per pre-operative assessment

## 2015-02-06 NOTE — Anesthesia Postprocedure Evaluation (Signed)
Anesthesia Post Note  Patient: Joshua Garrett  Procedure(s) Performed: Procedure(s) (LRB): APPENDECTOMY LAPAROSCOPIC (N/A)  Patient location during evaluation: PACU Anesthesia Type: General Level of consciousness: awake and alert and patient cooperative Pain management: pain level controlled Vital Signs Assessment: post-procedure vital signs reviewed and stable Respiratory status: spontaneous breathing and respiratory function stable Cardiovascular status: stable Anesthetic complications: no    Last Vitals:  Filed Vitals:   02/06/15 1745 02/06/15 1800  BP: 133/86 133/86  Pulse: 73 76  Temp:  36.7 C  Resp: 16 14    Last Pain:  Filed Vitals:   02/06/15 1802  PainSc: 2                  Marizol Borror S

## 2015-02-07 ENCOUNTER — Encounter (HOSPITAL_COMMUNITY): Payer: Self-pay | Admitting: *Deleted

## 2015-02-07 MED ORDER — OXYCODONE-ACETAMINOPHEN 5-325 MG PO TABS: 1.0000 | ORAL_TABLET | ORAL | Status: DC | PRN

## 2015-02-07 NOTE — Progress Notes (Signed)
1 Day Post-Op  Subjective: No complaints. Hasn't eaten much or ambulated. Not sure if he can go home yet  Objective: Vital signs in last 24 hours: Temp:  [97.4 F (36.3 C)-98.3 F (36.8 C)] 97.8 F (36.6 C) (12/17 0519) Pulse Rate:  [68-86] 68 (12/17 0519) Resp:  [12-17] 16 (12/17 0519) BP: (110-155)/(70-125) 119/70 mmHg (12/17 0519) SpO2:  [96 %-100 %] 99 % (12/17 0519) Weight:  [92 kg (202 lb 13.2 oz)] 92 kg (202 lb 13.2 oz) (12/16 1832)    Intake/Output from previous day: 12/16 0701 - 12/17 0700 In: 950 [I.V.:900; IV Piggyback:50] Out: 500 [Urine:500] Intake/Output this shift:    Resp: clear to auscultation bilaterally Cardio: regular rate and rhythm GI: soft, nontender  Lab Results:   Recent Labs  02/06/15 1030  WBC 12.4*  HGB 14.0*  HCT 42.5*   BMET  Recent Labs  02/06/15 1226  NA 136  K 3.4*  CL 97*  CO2 31  GLUCOSE 87  BUN 14  CREATININE 1.00  CALCIUM 8.6*   PT/INR No results for input(s): LABPROT, INR in the last 72 hours. ABG No results for input(s): PHART, HCO3 in the last 72 hours.  Invalid input(s): PCO2, PO2  Studies/Results: Ct Abdomen Pelvis W Contrast  02/06/2015  CLINICAL DATA:  Abdominal pain. Pain radiating to back. Possible appendicitis. EXAM: CT ABDOMEN AND PELVIS WITH CONTRAST TECHNIQUE: Multidetector CT imaging of the abdomen and pelvis was performed using the standard protocol following bolus administration of intravenous contrast. CONTRAST:  100mL OMNIPAQUE IOHEXOL 300 MG/ML  SOLN COMPARISON:  None. FINDINGS: Lower chest: Mild motion degradation throughout. Clear lung bases. Normal heart size without pericardial or pleural effusion. Fluid level in the thoracic esophagus on image 12/series 2. Hepatobiliary: Mild hepatic steatosis. Foci of hyper enhancement within the liver. The most well-defined measures 9 mm in the left hepatic lobe on image 25/series 2. Normal gallbladder. Common duct is mildly dilated, 11 mm on coronal image 49.  No obstructive mass or stone identified. There is a periampullary duodenal diverticulum. Pancreas: Normal, without mass or ductal dilatation. Spleen: Normal in size, without focal abnormality. Adrenals/Urinary Tract: Normal adrenal glands. Normal kidneys, without hydronephrosis. Normal urinary bladder. Stomach/Bowel: Normal stomach, without wall thickening. Normal colon and terminal ileum. The appendiceal tip measures 9 mm on image 61/ series 2. A tiny appendicolith identified within. Suggestion of mucosal hyper enhancement on image 60/series 2. Normal small bowel. Vascular/Lymphatic: Normal caliber of the aorta and branch vessels. No abdominopelvic adenopathy. Reproductive: Normal prostate. Other: No significant free fluid. Musculoskeletal: Scattered bone islands. IMPRESSION: 1. Mild appendiceal enlargement with tiny appendicolith and suggestion of mucosal hyper enhancement. Findings are suspicious for early or mild appendicitis, especially involving the tip. No acute complication. 2. Otherwise, mildly motion degraded exam. 3. Common duct dilatation, without cause identified. Correlate with bilirubin level. If this is elevated, consider nonemergent outpatient MRCP or ERCP. 4. Esophageal air fluid level suggests dysmotility or gastroesophageal reflux. 5. Hepatic steatosis with Hypervascular foci within the liver. Presuming no history of primary malignancy, likely perfusion anomalies or small hemangiomas. These results were called by telephone at the time of interpretation on 02/06/2015 at 1:54 pm to Dr. Lorre NickANTHONY ALLEN , who verbally acknowledged these results. Electronically Signed   By: Jeronimo GreavesKyle  Talbot M.D.   On: 02/06/2015 13:57    Anti-infectives: Anti-infectives    Start     Dose/Rate Route Frequency Ordered Stop   02/07/15 1000  cefTRIAXone (ROCEPHIN) 2 g in dextrose 5 % 50 mL IVPB  2 g 100 mL/hr over 30 Minutes Intravenous Every 24 hours 02/06/15 1806     02/06/15 2359  metroNIDAZOLE (FLAGYL) IVPB 500  mg     500 mg 100 mL/hr over 60 Minutes Intravenous Every 8 hours 02/06/15 1806     02/06/15 1445  [MAR Hold]  cefTRIAXone (ROCEPHIN) 2 g in dextrose 5 % 50 mL IVPB  Status:  Discontinued     (MAR Hold since 02/06/15 1626)  Comments:  Pharmacy may adjust dosing strength / duration / interval for maximal efficacy   2 g 100 mL/hr over 30 Minutes Intravenous Every 24 hours 02/06/15 1436 02/06/15 1806   02/06/15 1445  [MAR Hold]  metroNIDAZOLE (FLAGYL) IVPB 500 mg  Status:  Discontinued     (MAR Hold since 02/06/15 1626)   500 mg 100 mL/hr over 60 Minutes Intravenous Every 6 hours 02/06/15 1436 02/06/15 1806      Assessment/Plan: s/p Procedure(s): APPENDECTOMY LAPAROSCOPIC (N/A) Advance diet Discharge later today if he feels like he can make it     TOTH III,Rushie Brazel S 02/07/2015

## 2015-02-07 NOTE — Progress Notes (Signed)
Nurse reviewed discharge instructions with pt and his family.  No concerns at time of discharge.  Pt and family verbalized understanding of discharge instructions, follow up appointment and new medications.  Prescription given to pt prior to discharge.

## 2015-02-12 ENCOUNTER — Encounter (HOSPITAL_COMMUNITY): Payer: Self-pay | Admitting: Surgery

## 2015-03-04 NOTE — Discharge Summary (Signed)
Physician Discharge Summary  Joshua Garrett ZOX:[id number] DOB: 12/20/1965 DOA: 02/06/2015  PCP: Tally DueGUEST, CHRIS WARREN, MD  Consultation:  Admit date: 02/06/2015 Discharge date: 02/07/2015  Recommendations for Outpatient Follow-up:    Follow-up Information    Follow up with Velora HecklerGERKIN,TODD M, MD In 2 weeks.   Specialty:  General Surgery   Contact information:   [address] [place] [address] [phone]      Discharge Diagnoses:  1. Acute appendicitis    Surgical Procedure: laparoscopic appendectomy---Dr. Gerrit FriendsGerkin  Discharge Condition: stable Disposition: home  Diet recommendation: regular  Filed Weights   02/06/15 1832  Weight: 92 kg (202 lb 13.2 oz)       Hospital Course:  [facility]. CT of A/P shows appendiceal enlargement, appendicolith suspicious for appendicitis. WBC is 12.4k.  CT also showed a dilated CBD. There are no LFTs. Hepatic steatosis.  He underwent the procedure listed above.  He was asked to follow up with PCP regarding dilated CBD.  On POD#1 the patient was felt stable for discharge home.  Please note i did not examine or speak with the patient on the day of discharge.  Upon admission, we communicated via telephone interpreter regarding CT findings.  Discharge Instructions  Discharge Instructions    Call MD for:  difficulty breathing, headache or visual disturbances    Complete by:  As directed      Call MD for:  extreme fatigue    Complete by:  As directed      Call MD for:  hives    Complete by:  As directed      Call MD for:  persistant dizziness or light-headedness    Complete by:  As directed      Call MD for:  persistant nausea and vomiting    Complete by:  As directed      Call MD for:  redness, tenderness, or signs of infection (pain, swelling, redness, odor or green/yellow discharge around incision site)    Complete by:  As directed      Call MD for:  severe uncontrolled pain     Complete by:  As directed      Call MD for:  temperature >100.4    Complete by:  As directed      Diet - low sodium heart healthy    Complete by:  As directed      Discharge instructions    Complete by:  As directed   May shower. No heavy lifting. Diet as tolerated     Increase activity slowly    Complete by:  As directed      No wound care    Complete by:  As directed             Medication List    TAKE these medications        oxyCODONE-acetaminophen 5-325 MG tablet  Commonly known as:  ROXICET  Take 1-2 tablets by mouth every 4 (four) hours as needed for severe pain.           Follow-up Information    Follow up with Velora HecklerGERKIN,TODD M, MD In 2 weeks.   Specialty:  General Surgery   Contact information:   [address] [place] [address] [phone]        The results of significant diagnostics from this hospitalization (including imaging, microbiology, ancillary and laboratory) are listed below for reference.    Significant Diagnostic Studies: Ct  Abdomen Pelvis W Contrast  02/06/2015  CLINICAL DATA:  Abdominal pain. Pain radiating to back. Possible appendicitis. EXAM: CT ABDOMEN AND PELVIS WITH CONTRAST TECHNIQUE: Multidetector CT imaging of the abdomen and pelvis was performed using the standard protocol following bolus administration of intravenous contrast. CONTRAST:  OMNIPAQUE IOHEXOL 300 MG/ML  SOLN COMPARISON:  None. FINDINGS: Lower chest: Mild motion degradation throughout. Clear lung bases. Normal heart size without pericardial or pleural effusion. Fluid level in the thoracic esophagus on image 12/series 2. Hepatobiliary: Mild hepatic steatosis. Foci of hyper enhancement within the liver. The most well-defined measures 9 mm in the left hepatic lobe on image 25/series 2. Normal gallbladder. Common duct is mildly dilated, 11 mm on coronal image 49. No obstructive mass or stone identified. There is a periampullary duodenal diverticulum. Pancreas:  Normal, without mass or ductal dilatation. Spleen: Normal in size, without focal abnormality. Adrenals/Urinary Tract: Normal adrenal glands. Normal kidneys, without hydronephrosis. Normal urinary bladder. Stomach/Bowel: Normal stomach, without wall thickening. Normal colon and terminal ileum. The appendiceal tip measures 9 mm on image 61/ series 2. A tiny appendicolith identified within. Suggestion of mucosal hyper enhancement on image 60/series 2. Normal small bowel. Vascular/Lymphatic: Normal caliber of the aorta and branch vessels. No abdominopelvic adenopathy. Reproductive: Normal prostate. Other: No significant free fluid. Musculoskeletal: Scattered bone islands. IMPRESSION: 1. Mild appendiceal enlargement with tiny appendicolith and suggestion of mucosal hyper enhancement. Findings are suspicious for early or mild appendicitis, especially involving the tip. No acute complication. 2. Otherwise, mildly motion degraded exam. 3. Common duct dilatation, without cause identified. Correlate with bilirubin level. If this is elevated, consider nonemergent outpatient MRCP or ERCP. 4. Esophageal air fluid level suggests dysmotility or gastroesophageal reflux. 5. Hepatic steatosis with Hypervascular foci within the liver. Presuming no history of primary malignancy, likely perfusion anomalies or small hemangiomas. These results were called by telephone at the time of interpretation on 02/06/2015 at 1:54 pm to Dr. Lorre Nick , who verbally acknowledged these results. Electronically Signed   By: Jeronimo Greaves M.D.   On: 02/06/2015 13:57    Microbiology: No results found for this or any previous visit (from the past 240 hour(s)).   Labs: Basic Metabolic Panel: No results for input(s): NA, K, CL, CO2, GLUCOSE, BUN, CREATININE, CALCIUM, MG, PHOS in the last 168 hours. Liver Function Tests: No results for input(s): AST, ALT, ALKPHOS, BILITOT, PROT, ALBUMIN in the last 168 hours. No results for input(s): LIPASE,  AMYLASE in the last 168 hours. No results for input(s): AMMONIA in the last 168 hours. CBC: No results for input(s): WBC, NEUTROABS, HGB, HCT, MCV, PLT in the last 168 hours. Cardiac Enzymes: No results for input(s): CKTOTAL, CKMB, CKMBINDEX, TROPONINI in the last 168 hours. BNP: BNP (last 3 results) No results for input(s): BNP in the last 8760 hours.  ProBNP (last 3 results) No results for input(s): PROBNP in the last 8760 hours.  CBG: No results for input(s): GLUCAP in the last 168 hours.  Active Problems:   Acute appendicitis   Signed:  Nastasia Kage, ANP-BC

## 2015-03-17 ENCOUNTER — Ambulatory Visit (INDEPENDENT_AMBULATORY_CARE_PROVIDER_SITE_OTHER): Payer: 59

## 2015-03-17 ENCOUNTER — Ambulatory Visit (INDEPENDENT_AMBULATORY_CARE_PROVIDER_SITE_OTHER): Payer: 59 | Admitting: Family Medicine

## 2015-03-17 VITALS — BP 120/80 | HR 74 | Temp 98.3°F | Resp 16 | Ht 66.0 in | Wt 204.0 lb

## 2015-03-17 DIAGNOSIS — M25511 Pain in right shoulder: Secondary | ICD-10-CM

## 2015-03-17 DIAGNOSIS — M542 Cervicalgia: Secondary | ICD-10-CM | POA: Diagnosis not present

## 2015-03-17 DIAGNOSIS — M545 Low back pain: Secondary | ICD-10-CM | POA: Diagnosis not present

## 2015-03-17 DIAGNOSIS — M79632 Pain in left forearm: Secondary | ICD-10-CM

## 2015-03-17 MED ORDER — CYCLOBENZAPRINE HCL 5 MG PO TABS: ORAL_TABLET | ORAL | Status: DC

## 2015-03-17 MED ORDER — MELOXICAM 7.5 MG PO TABS: 7.5000 mg | ORAL_TABLET | Freq: Every day | ORAL | Status: DC

## 2015-03-17 NOTE — Progress Notes (Signed)
Subjective:  This chart was scribed for Joshua Staggers MD,  by Joshua Garrett, at [facility].  This patient was seen in room 1 and the patient's care was started at 10:20 AM.   Chief Complaint  Patient presents with  . Shoulder Pain    right, x 2 weeks,  pt. says hurts from neck to lower back   . Arm Pain    left lower, x 1 week      Patient ID: Joshua Garrett, male    DOB: 1965-03-20, 50 y.o.   MRN: [id number]  HPI  HPI Comments: Joshua Garrett is a 50 y.o. male who presents to the [facility] complaining of upper right shoulder pain radiating to his right neck, right lower back pain and left forearm onset two weeks ago.  Patient denies any recent injury/falls.  Patient had a laproscopic appendectomy in December 2016.  He was not having pains up until the past two weeks and did not have pain before his surgery.  Patient took Advil last night (4 pills at once) and does this everyday since the pain has started.    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  He denies increasing his hours at work or doing anything differently at work which may have caused his injury.  Patient was off of work for three weeks due to his surgery and then he went back to work with the same amount that he was working prior to surgery.   Patient was asked if there was any misunderstanding due to language barrier but he stated that he did understand.    Patient Active Problem List   Diagnosis Date Noted  . Acute appendicitis 02/06/2015  . Language barrier 07/05/2013   History reviewed. No pertinent past medical history. Past Surgical History  Procedure Laterality Date  . Laparoscopic appendectomy N/A 02/06/2015    Procedure: APPENDECTOMY LAPAROSCOPIC;  Surgeon: Darnell Level, MD;  Location: [facility];  Service: General;  Laterality: N/A;   No Known Allergies Prior to Admission medications   Medication Sig Start Date End Date Taking? Authorizing Provider    ibuprofen (ADVIL,MOTRIN) 100 MG/5ML suspension Take 200 mg by mouth every 4 (four) hours as needed.   Yes Historical Provider, MD   Social History   Social History  . Marital Status: Married    Spouse Name: N/A  . Number of Children: 4  . Years of Education: N/A   Occupational History  . Manual Labor    Social History Main Topics  . Smoking status: Former Smoker    Quit date: 02/22/1987  . Smokeless tobacco: Never Used  . Alcohol Use: No  . Drug Use: No  . Sexual Activity: Yes   Other Topics Concern  . Not on file   Social History Narrative   Originally from [place].  Came to the [place] 09/03/2003. Lives with his wife and 4 children.    Review of Systems  Constitutional: Negative for fever and chills.  Eyes: Negative for pain, redness and itching.  Respiratory: Negative for cough and choking.   Gastrointestinal: Negative for nausea and vomiting.  Musculoskeletal: Positive for myalgias and neck pain.  Skin: Negative for color change.  Neurological: Negative for seizures and syncope.       Objective:   Physical Exam  Constitutional: He is oriented to person, place, and time. He appears well-developed and well-nourished. No distress.  HENT:  Head: Normocephalic and atraumatic.  Eyes:  Pupils are equal, round, and reactive to light.  Pulmonary/Chest: Effort normal. No respiratory distress.  Musculoskeletal:  reproducible right upper chest wall tenderness.  Some spasm and slight hypertrophy of his right trapezius Tenderness of the right paraspinal muscles but no midline bony tenderness Full ROM but pain into the lateral neck with left rotation and extension.  Tenderness over the right rhomboid, no midline bony tenderness.  Lower lumbar spine- no midline bony tenderness.  Full ROM of lumbar spine Tenderness along the right paraspinals Able to heel to toe walk okay.  Right shoulder: guarded with abduction of right shoulder due to pain at the pectoralis muscle.   Internal  external rotation is intact.  Negative lift off.  Full rotator cuff strength  Left elbow- Tender along the lateral epicondyle  Tenderness along the pronator and dorsal forearm but no bony tenderness of the radius or ulna.   Neurological: He is alert and oriented to person, place, and time.  Reflex Scores:      Tricep reflexes are 2+ on the right side and 2+ on the left side.      Bicep reflexes are 2+ on the right side and 2+ on the left side.      Brachioradialis reflexes are 2+ on the right side and 2+ on the left side.      Patellar reflexes are 1+ on the right side and 1+ on the left side.      Achilles reflexes are 1+ on the right side and 1+ on the left side.    Skin: Skin is warm and dry.    Filed Vitals:   03/17/15 0936  BP: 120/80  Pulse: 74  Temp: 98.3 F (36.8 C)  TempSrc: Oral  Resp: 16  Height:  (1.676 m)  Weight: 204 lb (92.534 kg)  SpO2: 98%   [facility] (PRIMARY) x-ray report read by Dr. Neva Seat:  Cervical spine: minimal degenerative changes, c 6-7 , no fracture straightening of cervical spine, no fracture.   Right shoulder:  Negative for fracture, no acute findings  Left forearm: no fracture, there is fragmentation at the lateral epicondyle versus evulsion.      Assessment & Plan:   [name] is a 50 y.o. male Pain in joint of right shoulder - Plan: DG Shoulder Right, meloxicam (MOBIC) 7.5 MG tablet, cyclobenzaprine (FLEXERIL) 5 MG tablet  Neck pain on right side - Plan: DG Cervical Spine 2 or 3 views, meloxicam (MOBIC) 7.5 MG tablet, cyclobenzaprine (FLEXERIL) 5 MG tablet  Left forearm pain - Plan: DG Forearm Left, meloxicam (MOBIC) 7.5 MG tablet, cyclobenzaprine (FLEXERIL) 5 MG tablet  Low back pain without sciatica, unspecified back pain laterality - Plan: meloxicam (MOBIC) 7.5 MG tablet, cyclobenzaprine (FLEXERIL) 5 MG tablet  Multiple areas of myalgias, suspect based on history that time off of work, then immediate return to work may have led to  an overuse syndrome/injury. No known injury, no focal bony tenderness. Overall strength is intact.   -Trial of mobic once per day, avoid other NSAIDs, Flexeril night if needed. Side effects discussed.  - Follow-up next 1-2 weeks if not improving, sooner if worse.   -Also recommended relative rest from work for a day or 2 then slowly increasing back to his previous amount of work as tolerated. Note can be provided if needed. Meds ordered this encounter  Medications  . ibuprofen (ADVIL,MOTRIN) 100 MG/5ML suspension    Sig: Take 200 mg by mouth every 4 (four) hours as needed.  . meloxicam (  MOBIC) 7.5 MG tablet    Sig: Take 1 tablet (7.5 mg total) by mouth daily.    Dispense:  30 tablet    Refill:  0  . cyclobenzaprine (FLEXERIL) 5 MG tablet    Sig: 1 pill by mouth up to every 8 hours as needed. Start with one pill by mouth each bedtime as needed due to sedation    Dispense:  15 tablet    Refill:  0   Patient Instructions  Because you received an x-ray today, you will receive an invoice from [facility]. Please contact [facility] at [phone] with questions or concerns regarding your invoice. Our billing staff will not be able to assist you with those questions.   Overall your x-rays looked okay. I suspect you have some pain in various areas from return to work after some time off. You can try the meloxicam 1 pill once per day, (do not take Advil or Aleve or other medicines over-the-counter with this medicine). If needed at night, can take the muscle relaxant Flexeril up to every 8 hours. A day or 2 off of work then slowly increasing back to the amount of work each day may also be helpful. Let me know if you need a note that allows you to do so. Follow-up with me in the next week to 2 weeks if not improving. Sooner if worse.    I personally performed the services described in this documentation, which was scribed in my presence. The recorded information has been reviewed  and considered, and addended by me as needed.

## 2015-03-17 NOTE — Patient Instructions (Addendum)
Because you received an x-ray today, you will receive an invoice from [facility]. Please contact [facility] at [phone] with questions or concerns regarding your invoice. Our billing staff will not be able to assist you with those questions.   Overall your x-rays looked okay. I suspect you have some pain in various areas from return to work after some time off. You can try the meloxicam 1 pill once per day, (do not take Advil or Aleve or other medicines over-the-counter with this medicine). If needed at night, can take the muscle relaxant Flexeril up to every 8 hours. A day or 2 off of work then slowly increasing back to the amount of work each day may also be helpful. Let me know if you need a note that allows you to do so. Follow-up with me in the next week to 2 weeks if not improving. Sooner if worse.

## 2017-11-24 ENCOUNTER — Other Ambulatory Visit: Payer: Self-pay

## 2017-11-24 ENCOUNTER — Encounter: Payer: Self-pay | Admitting: Physician Assistant

## 2017-11-24 ENCOUNTER — Ambulatory Visit: Payer: BLUE CROSS/BLUE SHIELD | Admitting: Physician Assistant

## 2017-11-24 VITALS — BP 139/88 | HR 81 | Temp 98.2°F | Resp 16 | Ht 66.0 in | Wt 205.8 lb

## 2017-11-24 DIAGNOSIS — R1030 Lower abdominal pain, unspecified: Secondary | ICD-10-CM | POA: Diagnosis not present

## 2017-11-24 LAB — POCT URINALYSIS DIP (MANUAL ENTRY)
Bilirubin, UA: NEGATIVE
Glucose, UA: NEGATIVE mg/dL
Ketones, POC UA: NEGATIVE mg/dL
Leukocytes, UA: NEGATIVE
Nitrite, UA: NEGATIVE
Protein Ur, POC: 30 mg/dL — AB
Spec Grav, UA: 1.025 (ref 1.010–1.025)
Urobilinogen, UA: 0.2 E.U./dL
pH, UA: 5.5 (ref 5.0–8.0)

## 2017-11-24 MED ORDER — PANTOPRAZOLE SODIUM 40 MG PO TBEC: DELAYED_RELEASE_TABLET | ORAL | 0 refills | Status: DC

## 2017-11-24 NOTE — Patient Instructions (Addendum)
For your reflux symptoms: Take Pantoprazole 40mg  30 min before the first meal of the day for 3-4 weeks. Take this every morning, do not skip a dose. Once you finish the medicine, after 3 weeks if you are still not better come back and see me -- we will do further testing.     For constipation  Look up "6 Reasons to Care about Poop Health" at [employer] (http://www.edwards.info/)  * If your stools are hard or are formed balls or you have to strain a stool softener will help - use colace 2-3 capsule a day for 1-2 weeks  Otherwise: For gentle treatment of constipation. Use Miralax 1-2 capfuls a day until your stools are soft and regular and then decrease the usage - you can use this daily  1) Water: Make sure you are drinking enough water daily - about 1-3 liters. 2) Fiber: Make sure you are getting enough fiber in your diet - this will make you regular - you can eat high fiber foods or use metamucil as a supplement - it is really important to drink enough water when using fiber supplements. Foods that have a lot of fiber include vegetables, fruits, beans, nuts, oatmeal, and some breads and cereals. You can tell how much fiber is in a food by reading the nutrition label. Doctors recommend eating 25 to 36 grams of fiber each day. 3) Fitness: Increasing your physical activity will help increase the natural movement of your bowels. Try to get 20-30 minutes of exercise daily.  4) Use a 9" stool in front of your toilet to rest your feet on while you have a bowel movement. This will loosen your rectal muscles to help your stool come out easier and prevent straining.   ?Refrain from straining or lingering (eg, reading) on the toilet.  ?Limit intake of fatty foods and alcohol, which can exacerbate constipation.   ------------------------------------------------------------------------------------------ Do not take Advil or Goody Powder on a regular basis.    IF you received an  x-ray today, you will receive an invoice from [facility]. Please contact [facility] at [phone] with questions or concerns regarding your invoice.   IF you received labwork today, you will receive an invoice from [facility]. Please contact [facility] at [phone] with questions or concerns regarding your invoice.   Our billing staff will not be able to assist you with questions regarding bills from these companies.  You will be contacted with the lab results as soon as they are available. The fastest way to get your results is to activate your My Chart account. Instructions are located on the last page of this paperwork. If you have not heard from [place] regarding the results in 2 weeks, please contact this office.

## 2017-11-24 NOTE — Progress Notes (Signed)
   Joshua Garrett  MRN: [id number] DOB: 11-Aug-1965  PCP: Patient, No Pcp Per  Subjective:  Pt is a 52 year old male who presents to clinic for several complaints. He is here today with an interpreter, speaks [place].   Abdominal pain - pain left side x three days. Last BM was this morning. He has to strain to have BM. Spicy foods make it better - helps him go to the bathroom.  Stools are small balls.  Drinks a lot of water.  Diet: white rice, veggies,  Denies diarrhea, blood in stool, fever, chills.  Take Advil. Take 4 pills twice daily - every day. And Goody powder.    Review of Systems  Gastrointestinal: Positive for abdominal pain and constipation. Negative for blood in stool, diarrhea, nausea and vomiting.    Patient Active Problem List   Diagnosis Date Noted  . Acute appendicitis 02/06/2015  . Language barrier 07/05/2013    Current Outpatient Medications on File Prior to Visit  Medication Sig Dispense Refill  . ibuprofen (ADVIL,MOTRIN) 100 MG/5ML suspension Take 200 mg by mouth every 4 (four) hours as needed.    . cyclobenzaprine (FLEXERIL) 5 MG tablet 1 pill by mouth up to every 8 hours as needed. Start with one pill by mouth each bedtime as needed due to sedation (Patient not taking: Reported on 11/24/2017) 15 tablet 0  . meloxicam (MOBIC) 7.5 MG tablet Take 1 tablet (7.5 mg total) by mouth daily. (Patient not taking: Reported on 11/24/2017) 30 tablet 0   No current facility-administered medications on file prior to visit.     No Known Allergies   Objective:  There were no vitals taken for this visit.  Physical Exam  Constitutional: He appears well-developed and well-nourished. He does not appear ill. No distress.  Abdominal: There is no hepatosplenomegaly. There is generalized tenderness. There is no rigidity, no guarding, no tenderness at McBurney's point and negative Murphy's sign.  Psychiatric: He has a normal mood and affect. His behavior is normal.  Vitals  reviewed.  Results for orders placed or performed in visit on 11/24/17  POCT urinalysis dipstick  Result Value Ref Range   Color, UA yellow yellow   Clarity, UA clear clear   Glucose, UA negative negative mg/dL   Bilirubin, UA negative negative   Ketones, POC UA negative negative mg/dL   Spec Grav, UA 4.098 1.191 - 1.025   Blood, UA trace-intact (A) negative   pH, UA 5.5 5.0 - 8.0   Protein Ur, POC =30 (A) negative mg/dL   Urobilinogen, UA 0.2 0.2 or 1.0 E.U./dL   Nitrite, UA Negative Negative   Leukocytes, UA Negative Negative    Assessment and Plan :  1. Lower abdominal pain - vitals are stable, UA negative. PE is unremarkable. Suspect constipation.  Discussed medication side effects and risk profile of Advil and Goody Powders. Advised to take these medications on an occasional basis. RTC if no improvement  - POCT urinalysis dipstick - pantoprazole (PROTONIX) 40 MG tablet; Take one pill 30 min before the first meal of the day  Dispense: 30 tablet; Refill: 0   Whitney Camdyn Laden, PA-C  Primary Care at [facility] 11/24/2017 5:55 PM  Please note: Portions of this report may have been transcribed using dragon voice recognition software. Every effort was made to ensure accuracy; however, inadvertent computerized transcription errors may be present.

## 2018-01-02 ENCOUNTER — Other Ambulatory Visit: Payer: Self-pay | Admitting: Physician Assistant

## 2018-01-02 DIAGNOSIS — R1030 Lower abdominal pain, unspecified: Secondary | ICD-10-CM

## 2018-01-03 NOTE — Telephone Encounter (Signed)
Requested medication (s) are due for refill today:  yes  Requested medication (s) are on the active medication list:  yes  Future visit scheduled:  no  Last Refill: 11/24/17; #30; no refills  Attempted to call pt. To discuss current symptoms and need for refill of Pantoprazole; unable to reach by phone as voice mail not set up.  Per office note on 11/24/17: For your reflux symptoms: Take Pantoprazole 40mg  30 min before the first meal of the day for 3-4 weeks. Take this every morning, do not skip a dose. Once you finish the medicine, after 3 weeks if you are still not better come back and see me -- we will do further testing.   Requested Prescriptions  Pending Prescriptions Disp Refills   pantoprazole (PROTONIX) 40 MG tablet [Pharmacy Med Name: PANTOPRAZOLE 40MG  TABLETS] 30 tablet 0    Sig: TAKE 1 TABLET BY MOUTH EVERY DAY 30 MINUTES BEFORE THE FIRST MEAL OF THE DAY     Gastroenterology: Proton Pump Inhibitors Failed - 01/02/2018  6:40 PM      Failed - Valid encounter within last 12 months    Recent Outpatient Visits          1 month ago Lower abdominal pain   [facility], Madelaine BhatElizabeth Whitney, PA-C   2 years ago Pain in joint of right shoulder   [facility], Asencion PartridgeJeffrey R, MD   2 years ago Lower abdominal pain   [facility], Steven A, MD   3 years ago Poison ivy   [facility], Tessa LernerJeffery S, MD   3 years ago Plantar fasciitis of left foot   [facility], Kurt, MD

## 2018-08-06 DIAGNOSIS — S43402A Unspecified sprain of left shoulder joint, initial encounter: Secondary | ICD-10-CM | POA: Diagnosis not present

## 2018-08-06 DIAGNOSIS — S46912A Strain of unspecified muscle, fascia and tendon at shoulder and upper arm level, left arm, initial encounter: Secondary | ICD-10-CM | POA: Diagnosis not present

## 2018-08-08 ENCOUNTER — Ambulatory Visit: Payer: BLUE CROSS/BLUE SHIELD | Admitting: Family Medicine

## 2018-09-17 DIAGNOSIS — L237 Allergic contact dermatitis due to plants, except food: Secondary | ICD-10-CM | POA: Diagnosis not present

## 2018-09-17 DIAGNOSIS — H02844 Edema of left upper eyelid: Secondary | ICD-10-CM | POA: Diagnosis not present

## 2018-09-25 DIAGNOSIS — R350 Frequency of micturition: Secondary | ICD-10-CM | POA: Diagnosis not present

## 2018-09-25 DIAGNOSIS — R631 Polydipsia: Secondary | ICD-10-CM | POA: Diagnosis not present

## 2018-09-25 DIAGNOSIS — R81 Glycosuria: Secondary | ICD-10-CM | POA: Diagnosis not present

## 2018-09-27 DIAGNOSIS — Z0000 Encounter for general adult medical examination without abnormal findings: Secondary | ICD-10-CM | POA: Diagnosis not present

## 2018-09-27 DIAGNOSIS — E1165 Type 2 diabetes mellitus with hyperglycemia: Secondary | ICD-10-CM | POA: Diagnosis not present

## 2018-10-06 DIAGNOSIS — E7800 Pure hypercholesterolemia, unspecified: Secondary | ICD-10-CM | POA: Diagnosis not present

## 2018-10-06 DIAGNOSIS — E1165 Type 2 diabetes mellitus with hyperglycemia: Secondary | ICD-10-CM | POA: Diagnosis not present

## 2018-10-19 DIAGNOSIS — E7800 Pure hypercholesterolemia, unspecified: Secondary | ICD-10-CM | POA: Diagnosis not present

## 2018-10-19 DIAGNOSIS — E1165 Type 2 diabetes mellitus with hyperglycemia: Secondary | ICD-10-CM | POA: Diagnosis not present

## 2018-10-22 DIAGNOSIS — H538 Other visual disturbances: Secondary | ICD-10-CM | POA: Diagnosis not present

## 2018-10-22 DIAGNOSIS — E1165 Type 2 diabetes mellitus with hyperglycemia: Secondary | ICD-10-CM | POA: Diagnosis not present

## 2018-10-24 DIAGNOSIS — H2513 Age-related nuclear cataract, bilateral: Secondary | ICD-10-CM | POA: Diagnosis not present

## 2018-10-24 DIAGNOSIS — H524 Presbyopia: Secondary | ICD-10-CM | POA: Diagnosis not present

## 2018-10-24 DIAGNOSIS — E119 Type 2 diabetes mellitus without complications: Secondary | ICD-10-CM | POA: Diagnosis not present

## 2018-10-24 DIAGNOSIS — H40013 Open angle with borderline findings, low risk, bilateral: Secondary | ICD-10-CM | POA: Diagnosis not present

## 2018-10-24 DIAGNOSIS — H35013 Changes in retinal vascular appearance, bilateral: Secondary | ICD-10-CM | POA: Diagnosis not present

## 2018-11-05 DIAGNOSIS — E1165 Type 2 diabetes mellitus with hyperglycemia: Secondary | ICD-10-CM | POA: Diagnosis not present

## 2018-11-05 DIAGNOSIS — B002 Herpesviral gingivostomatitis and pharyngotonsillitis: Secondary | ICD-10-CM | POA: Diagnosis not present

## 2018-12-27 DIAGNOSIS — E1165 Type 2 diabetes mellitus with hyperglycemia: Secondary | ICD-10-CM | POA: Diagnosis not present

## 2018-12-27 DIAGNOSIS — J069 Acute upper respiratory infection, unspecified: Secondary | ICD-10-CM | POA: Diagnosis not present

## 2018-12-27 DIAGNOSIS — R05 Cough: Secondary | ICD-10-CM | POA: Diagnosis not present

## 2019-01-04 DIAGNOSIS — R509 Fever, unspecified: Secondary | ICD-10-CM | POA: Diagnosis not present

## 2019-01-04 DIAGNOSIS — R05 Cough: Secondary | ICD-10-CM | POA: Diagnosis not present

## 2019-01-04 DIAGNOSIS — Z9189 Other specified personal risk factors, not elsewhere classified: Secondary | ICD-10-CM | POA: Diagnosis not present

## 2019-01-04 DIAGNOSIS — J069 Acute upper respiratory infection, unspecified: Secondary | ICD-10-CM | POA: Diagnosis not present

## 2019-11-23 ENCOUNTER — Emergency Department (HOSPITAL_COMMUNITY)
Admission: EM | Admit: 2019-11-23 | Discharge: 2019-11-24 | Disposition: A | Discharge status: Home / Self Care | Payer: Self-pay | Attending: Emergency Medicine | Admitting: Emergency Medicine

## 2019-11-23 ENCOUNTER — Encounter (HOSPITAL_COMMUNITY): Payer: Self-pay | Admitting: Emergency Medicine

## 2019-11-23 ENCOUNTER — Emergency Department (HOSPITAL_COMMUNITY): Payer: Self-pay

## 2019-11-23 ENCOUNTER — Other Ambulatory Visit: Payer: Self-pay

## 2019-11-23 DIAGNOSIS — S63501A Unspecified sprain of right wrist, initial encounter: Secondary | ICD-10-CM

## 2019-11-23 DIAGNOSIS — Z87891 Personal history of nicotine dependence: Secondary | ICD-10-CM | POA: Insufficient documentation

## 2019-11-23 DIAGNOSIS — Y92410 Unspecified street and highway as the place of occurrence of the external cause: Secondary | ICD-10-CM | POA: Insufficient documentation

## 2019-11-23 DIAGNOSIS — S60811A Abrasion of right wrist, initial encounter: Secondary | ICD-10-CM | POA: Insufficient documentation

## 2019-11-23 DIAGNOSIS — Y9389 Activity, other specified: Secondary | ICD-10-CM | POA: Insufficient documentation
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
## 2019-11-24 NOTE — Discharge Instructions (Addendum)
Take ibuprofen 600 mg every 6 hours as needed for pain.  Ice for 20 minutes every 2 hours while awake for the next 2 days.  Follow-up with your primary doctor if not improving in the next week.
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
## 2022-06-22 IMAGING — CR DG WRIST COMPLETE 3+V*R*
4 series · 4 of 4 positions shown · non-contrast
Comparison: None.

CLINICAL DATA: Wrist pain.

EXAM:
RIGHT WRIST - COMPLETE 3+ VIEW

[x wrist pa right]
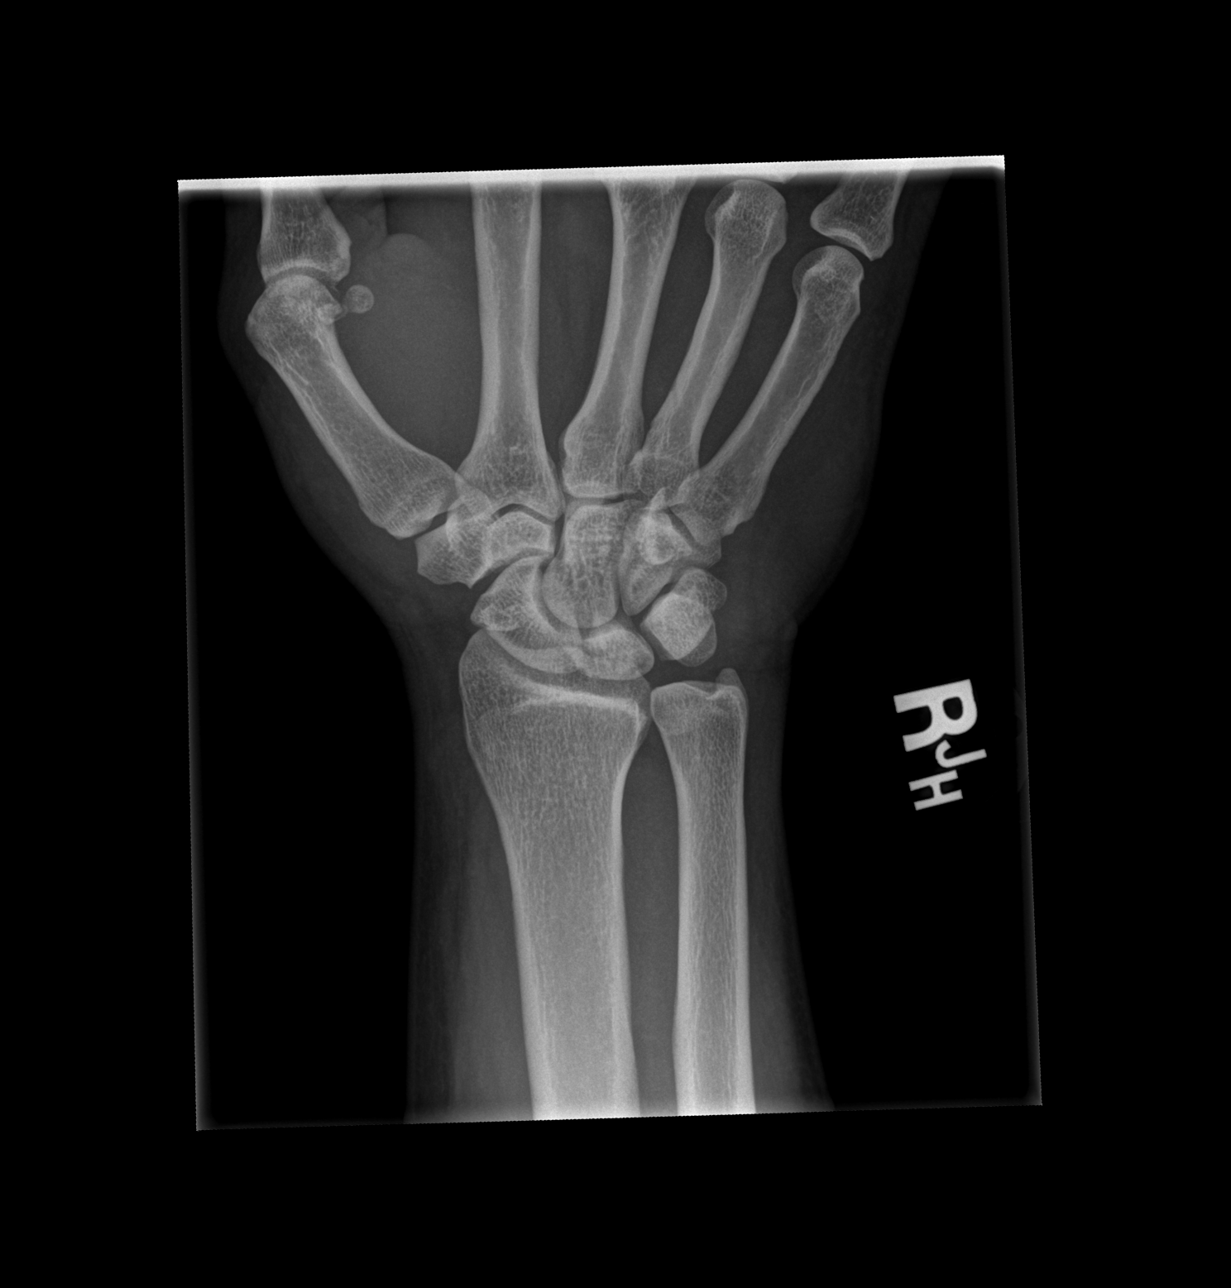

[x wrist obl right]
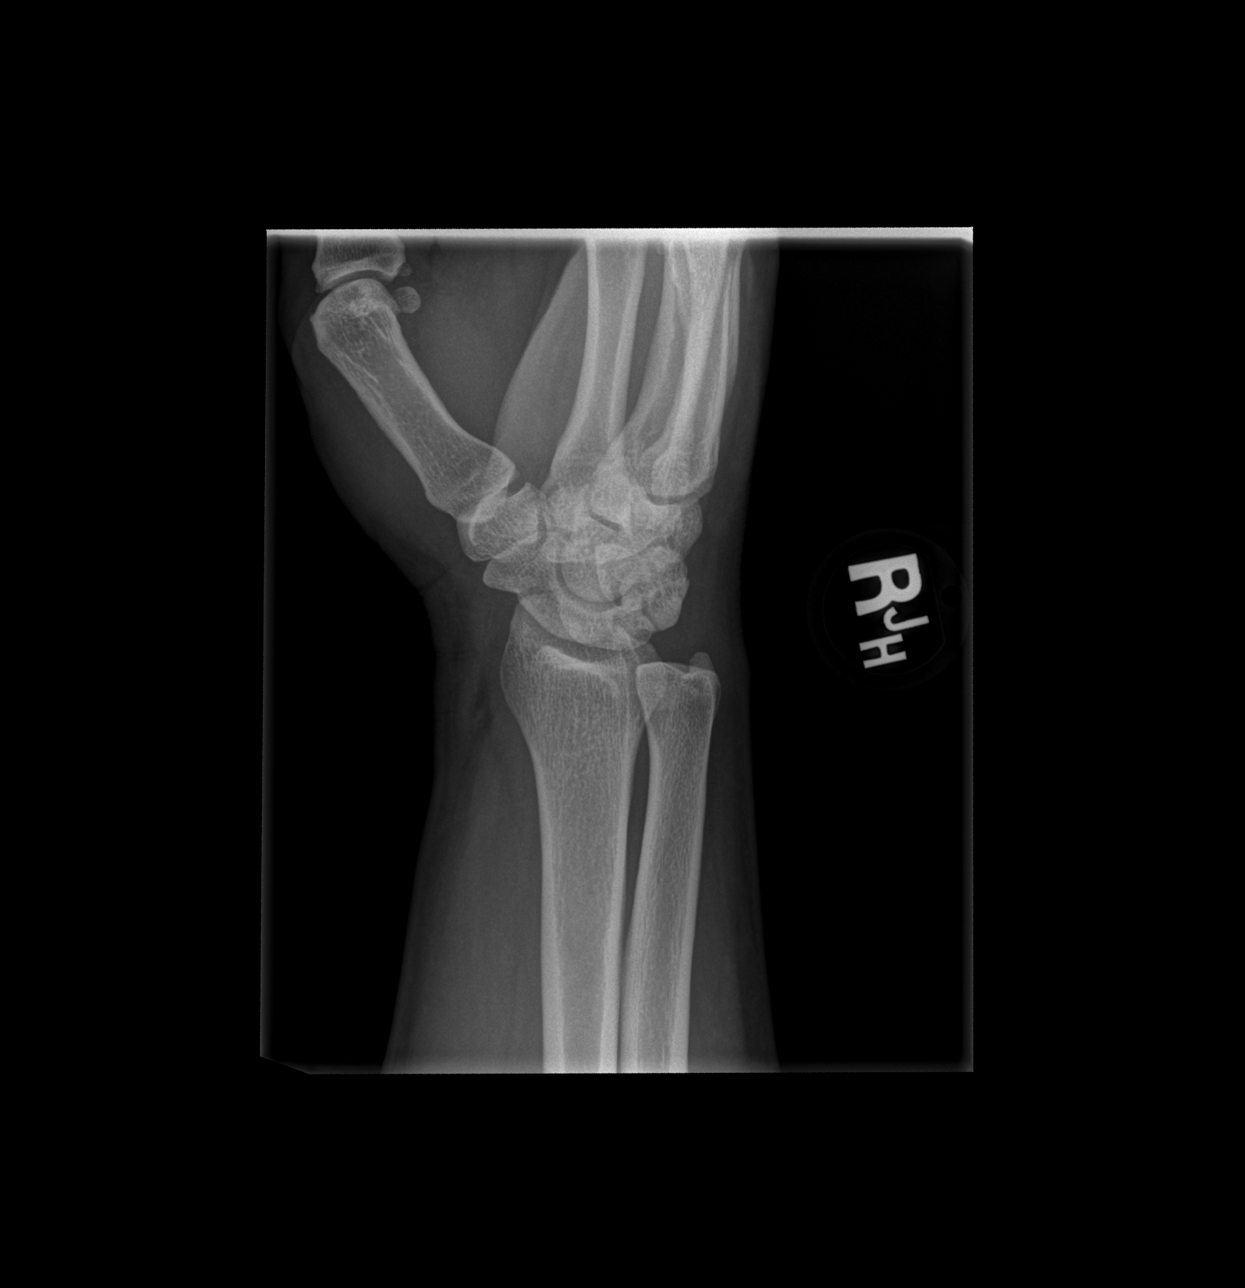

[x wrist lat right (1 of 2)]
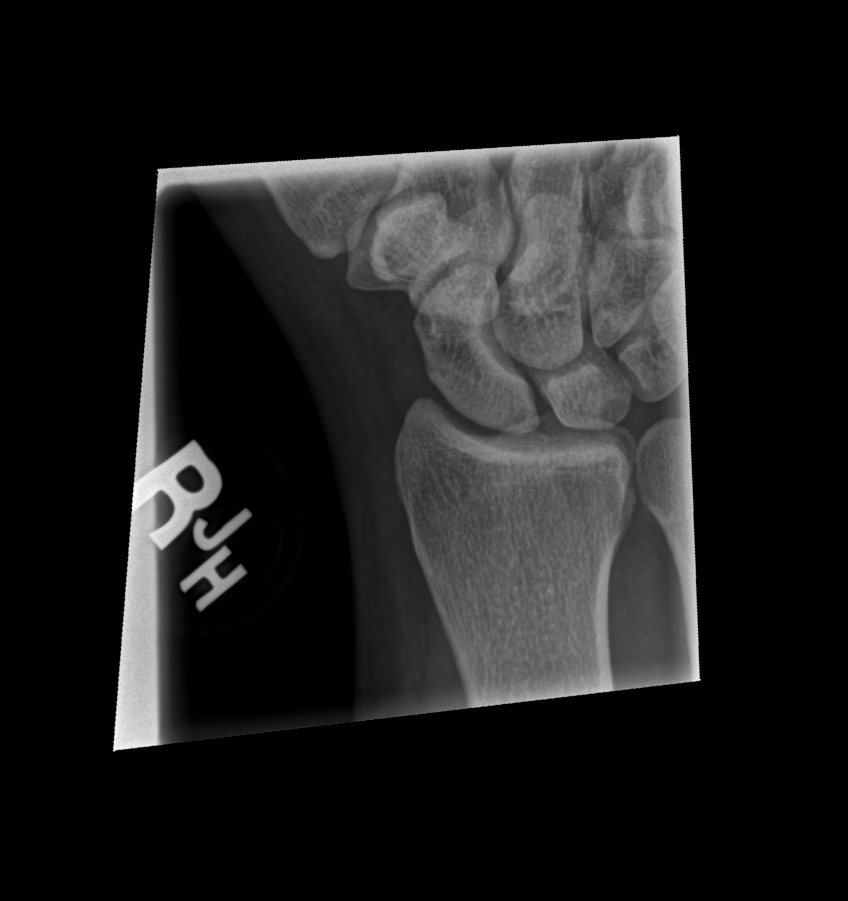

[x wrist lat right (2 of 2)]
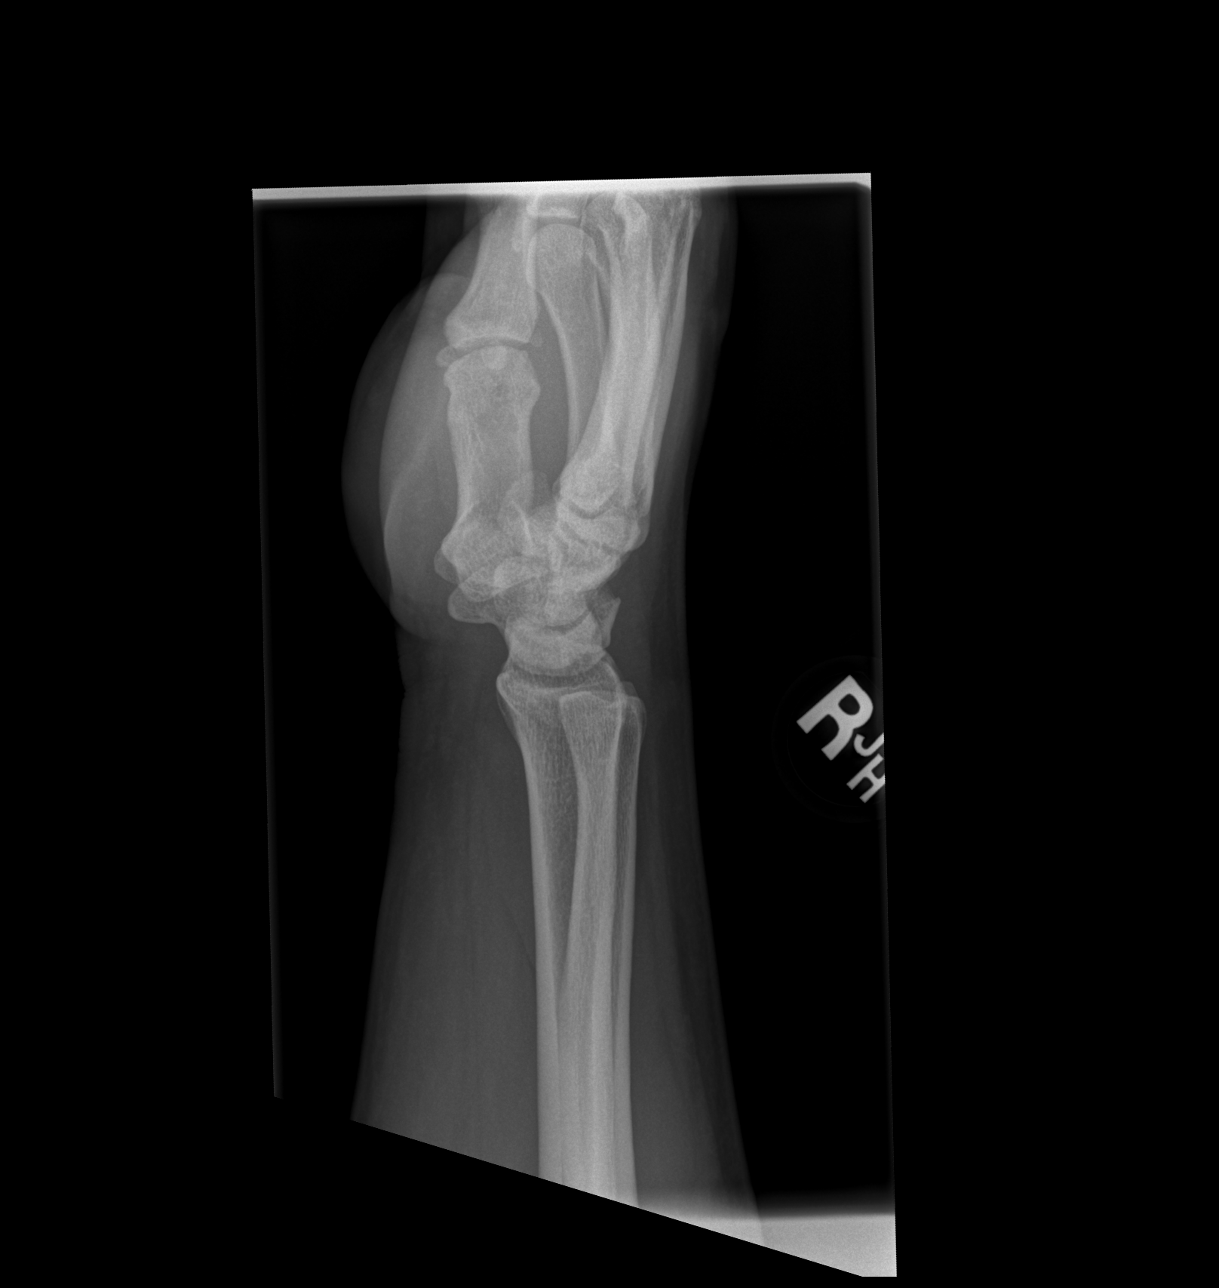

[4 of 4 positions shown; findings below may reference images not displayed]

FINDINGS: There is no evidence of fracture or dislocation. There is no
evidence of arthropathy or other focal bone abnormality. Soft
tissues are unremarkable.
IMPRESSION: Negative.
